# Patient Record
Sex: Male | Born: 1950 | Race: White | Hispanic: No | Marital: Married | Smoking: Never smoker
Health system: Southern US, Community
[De-identification: ages and names within clinical notes are randomized; demographics above are authoritative.]

## PROBLEM LIST (undated history)

## (undated) DIAGNOSIS — R310 Gross hematuria: Secondary | ICD-10-CM

## (undated) DIAGNOSIS — Z978 Presence of other specified devices: Secondary | ICD-10-CM

## (undated) DIAGNOSIS — Z87440 Personal history of urinary (tract) infections: Secondary | ICD-10-CM

## (undated) DIAGNOSIS — K602 Anal fissure, unspecified: Secondary | ICD-10-CM

## (undated) DIAGNOSIS — N400 Enlarged prostate without lower urinary tract symptoms: Secondary | ICD-10-CM

## (undated) DIAGNOSIS — F0280 Dementia in other diseases classified elsewhere without behavioral disturbance: Secondary | ICD-10-CM

## (undated) DIAGNOSIS — E785 Hyperlipidemia, unspecified: Secondary | ICD-10-CM

## (undated) DIAGNOSIS — R519 Headache, unspecified: Secondary | ICD-10-CM

## (undated) DIAGNOSIS — R972 Elevated prostate specific antigen [PSA]: Secondary | ICD-10-CM

## (undated) DIAGNOSIS — G4730 Sleep apnea, unspecified: Secondary | ICD-10-CM

## (undated) DIAGNOSIS — N138 Other obstructive and reflux uropathy: Secondary | ICD-10-CM

## (undated) DIAGNOSIS — M549 Dorsalgia, unspecified: Secondary | ICD-10-CM

## (undated) DIAGNOSIS — M1990 Unspecified osteoarthritis, unspecified site: Secondary | ICD-10-CM

## (undated) DIAGNOSIS — N419 Inflammatory disease of prostate, unspecified: Secondary | ICD-10-CM

## (undated) DIAGNOSIS — H547 Unspecified visual loss: Secondary | ICD-10-CM

## (undated) DIAGNOSIS — Z87442 Personal history of urinary calculi: Secondary | ICD-10-CM

## (undated) HISTORY — DX: Benign prostatic hyperplasia without lower urinary tract symptoms: N40.0

## (undated) HISTORY — DX: Inflammatory disease of prostate, unspecified: N41.9

## (undated) HISTORY — DX: Anal fissure, unspecified: K60.2

## (undated) HISTORY — DX: Dorsalgia, unspecified: M54.9

## (undated) HISTORY — DX: Hyperlipidemia, unspecified: E78.5

## (undated) HISTORY — DX: Personal history of urinary (tract) infections: Z87.440

## (undated) HISTORY — PX: LUMBAR FUSION: SHX111

---

## 1977-01-18 HISTORY — PX: VASECTOMY: SHX75

## 1993-12-24 ENCOUNTER — Encounter: Payer: Self-pay | Admitting: Physician Assistant

## 1994-10-04 ENCOUNTER — Encounter: Payer: Self-pay | Admitting: Family Medicine

## 1994-10-05 ENCOUNTER — Encounter: Payer: Self-pay | Admitting: Family Medicine

## 1994-10-05 LAB — CONVERTED CEMR LAB: RBC count: 4.97 10*6/uL

## 1996-11-15 ENCOUNTER — Encounter: Payer: Self-pay | Admitting: Family Medicine

## 1996-11-15 LAB — CONVERTED CEMR LAB: WBC, blood: 9.8 10*3/uL

## 1996-11-22 ENCOUNTER — Encounter: Payer: Self-pay | Admitting: Family Medicine

## 1996-11-23 ENCOUNTER — Encounter: Payer: Self-pay | Admitting: Family Medicine

## 1996-11-23 LAB — CONVERTED CEMR LAB
Blood Glucose, Fasting: 109 mg/dL
TSH: 2.85 microintl units/mL

## 1996-11-26 ENCOUNTER — Encounter: Payer: Self-pay | Admitting: Family Medicine

## 1996-11-26 LAB — CONVERTED CEMR LAB: Hgb A1c MFr Bld: 5.8 %

## 1997-01-16 ENCOUNTER — Encounter: Payer: Self-pay | Admitting: Family Medicine

## 1997-06-10 ENCOUNTER — Encounter: Payer: Self-pay | Admitting: Family Medicine

## 1997-06-10 LAB — CONVERTED CEMR LAB: TSH: 5.08 microintl units/mL

## 1999-10-12 ENCOUNTER — Encounter: Payer: Self-pay | Admitting: Family Medicine

## 1999-10-12 LAB — CONVERTED CEMR LAB: Blood Glucose, Fasting: 96 mg/dL

## 2001-02-28 ENCOUNTER — Ambulatory Visit (HOSPITAL_COMMUNITY): Admission: RE | Admit: 2001-02-28 | Discharge: 2001-02-28 | Payer: Self-pay | Admitting: Neurosurgery

## 2001-02-28 ENCOUNTER — Encounter: Payer: Self-pay | Admitting: Neurosurgery

## 2001-08-18 DIAGNOSIS — E785 Hyperlipidemia, unspecified: Secondary | ICD-10-CM

## 2001-08-18 HISTORY — DX: Hyperlipidemia, unspecified: E78.5

## 2001-08-22 ENCOUNTER — Encounter: Payer: Self-pay | Admitting: Family Medicine

## 2001-08-22 LAB — CONVERTED CEMR LAB
Blood Glucose, Fasting: 96 mg/dL
TSH: 2.37 microintl units/mL

## 2002-04-05 ENCOUNTER — Encounter: Payer: Self-pay | Admitting: Family Medicine

## 2002-04-05 ENCOUNTER — Encounter: Admission: RE | Admit: 2002-04-05 | Discharge: 2002-04-05 | Payer: Self-pay | Admitting: Family Medicine

## 2002-04-23 ENCOUNTER — Encounter: Payer: Self-pay | Admitting: Family Medicine

## 2002-04-23 LAB — CONVERTED CEMR LAB
Blood Glucose, Fasting: 99 mg/dL
PSA: 1.2 ng/mL
RBC count: 5.28 10*6/uL
WBC, blood: 5.7 10*3/uL

## 2002-05-18 ENCOUNTER — Ambulatory Visit (HOSPITAL_COMMUNITY): Admission: RE | Admit: 2002-05-18 | Discharge: 2002-05-19 | Payer: Self-pay | Admitting: Neurosurgery

## 2002-05-18 ENCOUNTER — Encounter: Payer: Self-pay | Admitting: Neurosurgery

## 2002-05-18 HISTORY — PX: CERVICAL FUSION: SHX112

## 2002-06-12 ENCOUNTER — Encounter: Admission: RE | Admit: 2002-06-12 | Discharge: 2002-06-12 | Payer: Self-pay | Admitting: Neurosurgery

## 2002-06-12 ENCOUNTER — Encounter: Payer: Self-pay | Admitting: Neurosurgery

## 2003-10-02 ENCOUNTER — Encounter: Payer: Self-pay | Admitting: Family Medicine

## 2003-10-02 LAB — CONVERTED CEMR LAB
RBC count: 4.81 10*6/uL
WBC, blood: 7.7 10*3/uL

## 2003-12-30 ENCOUNTER — Ambulatory Visit: Payer: Self-pay | Admitting: Family Medicine

## 2004-04-24 ENCOUNTER — Ambulatory Visit: Payer: Self-pay | Admitting: Family Medicine

## 2004-04-27 ENCOUNTER — Ambulatory Visit: Payer: Self-pay | Admitting: Family Medicine

## 2004-06-22 ENCOUNTER — Ambulatory Visit: Payer: Self-pay | Admitting: Family Medicine

## 2004-07-14 ENCOUNTER — Ambulatory Visit: Payer: Self-pay | Admitting: Family Medicine

## 2004-11-24 ENCOUNTER — Ambulatory Visit: Payer: Self-pay | Admitting: Family Medicine

## 2004-12-25 ENCOUNTER — Ambulatory Visit: Payer: Self-pay | Admitting: Family Medicine

## 2004-12-29 ENCOUNTER — Encounter: Admission: RE | Admit: 2004-12-29 | Discharge: 2004-12-29 | Payer: Self-pay | Admitting: Family Medicine

## 2005-01-26 ENCOUNTER — Ambulatory Visit: Payer: Self-pay | Admitting: Family Medicine

## 2005-11-10 ENCOUNTER — Ambulatory Visit: Payer: Self-pay | Admitting: Family Medicine

## 2006-01-03 ENCOUNTER — Ambulatory Visit: Payer: Self-pay | Admitting: Family Medicine

## 2006-01-03 LAB — CONVERTED CEMR LAB: TSH: 2.61 microintl units/mL

## 2006-01-06 ENCOUNTER — Ambulatory Visit: Payer: Self-pay | Admitting: Family Medicine

## 2006-01-27 ENCOUNTER — Ambulatory Visit: Payer: Self-pay | Admitting: Family Medicine

## 2006-03-21 ENCOUNTER — Ambulatory Visit: Payer: Self-pay | Admitting: Family Medicine

## 2006-08-19 ENCOUNTER — Ambulatory Visit: Payer: Self-pay | Admitting: Family Medicine

## 2006-08-19 DIAGNOSIS — G4700 Insomnia, unspecified: Secondary | ICD-10-CM | POA: Insufficient documentation

## 2006-11-09 ENCOUNTER — Ambulatory Visit: Payer: Self-pay | Admitting: Family Medicine

## 2006-11-09 DIAGNOSIS — L2089 Other atopic dermatitis: Secondary | ICD-10-CM

## 2007-04-27 ENCOUNTER — Ambulatory Visit: Payer: Self-pay | Admitting: Family Medicine

## 2007-04-27 DIAGNOSIS — H6980 Other specified disorders of Eustachian tube, unspecified ear: Secondary | ICD-10-CM | POA: Insufficient documentation

## 2007-04-27 DIAGNOSIS — N401 Enlarged prostate with lower urinary tract symptoms: Secondary | ICD-10-CM

## 2007-05-09 ENCOUNTER — Telehealth: Payer: Self-pay | Admitting: Family Medicine

## 2007-11-08 ENCOUNTER — Encounter: Payer: Self-pay | Admitting: Family Medicine

## 2007-11-08 DIAGNOSIS — E785 Hyperlipidemia, unspecified: Secondary | ICD-10-CM | POA: Insufficient documentation

## 2007-11-09 ENCOUNTER — Ambulatory Visit: Payer: Self-pay | Admitting: Family Medicine

## 2007-11-09 DIAGNOSIS — M5412 Radiculopathy, cervical region: Secondary | ICD-10-CM | POA: Insufficient documentation

## 2008-04-26 ENCOUNTER — Ambulatory Visit: Payer: Self-pay | Admitting: Family Medicine

## 2008-04-28 LAB — CONVERTED CEMR LAB
ALT: 28 units/L (ref 0–53)
Basophils Relative: 0.4 % (ref 0.0–3.0)
Bilirubin, Direct: 0.1 mg/dL (ref 0.0–0.3)
Chloride: 103 meq/L (ref 96–112)
Creatinine, Ser: 0.9 mg/dL (ref 0.4–1.5)
Eosinophils Relative: 2.8 % (ref 0.0–5.0)
HCT: 44.9 % (ref 39.0–52.0)
Hemoglobin: 15.5 g/dL (ref 13.0–17.0)
LDL Cholesterol: 131 mg/dL — ABNORMAL HIGH (ref 0–99)
MCV: 87.5 fL (ref 78.0–100.0)
Monocytes Absolute: 0.9 10*3/uL (ref 0.1–1.0)
Neutrophils Relative %: 55.1 % (ref 43.0–77.0)
PSA: 3.11 ng/mL (ref 0.10–4.00)
Potassium: 4.2 meq/L (ref 3.5–5.1)
RBC: 5.13 M/uL (ref 4.22–5.81)
Sodium: 138 meq/L (ref 135–145)
Total CHOL/HDL Ratio: 5
Total Protein: 7.6 g/dL (ref 6.0–8.3)
WBC: 7.5 10*3/uL (ref 4.5–10.5)

## 2008-05-06 ENCOUNTER — Encounter: Admission: RE | Admit: 2008-05-06 | Discharge: 2008-05-06 | Payer: Self-pay | Admitting: Neurosurgery

## 2008-05-08 ENCOUNTER — Ambulatory Visit: Payer: Self-pay | Admitting: Family Medicine

## 2008-05-17 ENCOUNTER — Ambulatory Visit: Payer: Self-pay | Admitting: Family Medicine

## 2008-05-17 LAB — CONVERTED CEMR LAB: OCCULT 3: NEGATIVE

## 2008-05-20 ENCOUNTER — Encounter (INDEPENDENT_AMBULATORY_CARE_PROVIDER_SITE_OTHER): Payer: Self-pay | Admitting: *Deleted

## 2009-06-04 ENCOUNTER — Encounter: Admission: RE | Admit: 2009-06-04 | Discharge: 2009-06-04 | Payer: Self-pay | Admitting: Neurosurgery

## 2009-06-10 ENCOUNTER — Ambulatory Visit: Payer: Self-pay | Admitting: Family Medicine

## 2009-06-10 DIAGNOSIS — R197 Diarrhea, unspecified: Secondary | ICD-10-CM

## 2009-06-12 ENCOUNTER — Encounter: Payer: Self-pay | Admitting: Family Medicine

## 2009-07-08 ENCOUNTER — Encounter: Admission: RE | Admit: 2009-07-08 | Discharge: 2009-07-08 | Payer: Self-pay | Admitting: Neurosurgery

## 2009-08-01 ENCOUNTER — Ambulatory Visit: Payer: Self-pay | Admitting: Family Medicine

## 2009-08-01 DIAGNOSIS — J309 Allergic rhinitis, unspecified: Secondary | ICD-10-CM | POA: Insufficient documentation

## 2009-08-01 DIAGNOSIS — M549 Dorsalgia, unspecified: Secondary | ICD-10-CM | POA: Insufficient documentation

## 2009-08-04 ENCOUNTER — Telehealth (INDEPENDENT_AMBULATORY_CARE_PROVIDER_SITE_OTHER): Payer: Self-pay | Admitting: *Deleted

## 2009-08-28 ENCOUNTER — Encounter: Payer: Self-pay | Admitting: Family Medicine

## 2009-08-28 ENCOUNTER — Ambulatory Visit: Payer: Self-pay | Admitting: Family Medicine

## 2009-08-28 DIAGNOSIS — L989 Disorder of the skin and subcutaneous tissue, unspecified: Secondary | ICD-10-CM | POA: Insufficient documentation

## 2009-09-11 ENCOUNTER — Inpatient Hospital Stay (HOSPITAL_COMMUNITY)
Admission: RE | Admit: 2009-09-11 | Discharge: 2009-10-05 | Payer: Self-pay | Source: Home / Self Care | Admitting: Neurosurgery

## 2009-09-30 ENCOUNTER — Ambulatory Visit: Payer: Self-pay | Admitting: Neurosurgery

## 2009-10-05 ENCOUNTER — Encounter: Payer: Self-pay | Admitting: Neurosurgery

## 2009-10-07 ENCOUNTER — Other Ambulatory Visit: Payer: Self-pay | Admitting: Neurosurgery

## 2009-10-18 ENCOUNTER — Encounter: Admission: RE | Admit: 2009-10-18 | Discharge: 2009-10-18 | Payer: Self-pay | Admitting: Neurosurgery

## 2009-10-21 ENCOUNTER — Ambulatory Visit: Payer: Self-pay | Admitting: Family Medicine

## 2009-10-21 DIAGNOSIS — R109 Unspecified abdominal pain: Secondary | ICD-10-CM

## 2009-10-31 ENCOUNTER — Telehealth: Payer: Self-pay | Admitting: Gastroenterology

## 2009-10-31 ENCOUNTER — Telehealth (INDEPENDENT_AMBULATORY_CARE_PROVIDER_SITE_OTHER): Payer: Self-pay | Admitting: *Deleted

## 2009-11-03 ENCOUNTER — Ambulatory Visit: Payer: Self-pay | Admitting: Gastroenterology

## 2009-11-03 DIAGNOSIS — Z860100 Personal history of colon polyps, unspecified: Secondary | ICD-10-CM | POA: Insufficient documentation

## 2009-11-03 DIAGNOSIS — K5900 Constipation, unspecified: Secondary | ICD-10-CM | POA: Insufficient documentation

## 2009-11-11 ENCOUNTER — Telehealth (INDEPENDENT_AMBULATORY_CARE_PROVIDER_SITE_OTHER): Payer: Self-pay | Admitting: *Deleted

## 2009-11-11 ENCOUNTER — Ambulatory Visit: Payer: Self-pay | Admitting: Physician Assistant

## 2009-11-11 LAB — CONVERTED CEMR LAB: Fecal Occult Bld: NEGATIVE

## 2009-12-10 ENCOUNTER — Encounter: Admission: RE | Admit: 2009-12-10 | Discharge: 2009-12-10 | Payer: Self-pay | Admitting: Neurosurgery

## 2009-12-16 ENCOUNTER — Encounter: Admission: RE | Admit: 2009-12-16 | Discharge: 2009-12-16 | Payer: Self-pay | Admitting: Neurosurgery

## 2009-12-22 ENCOUNTER — Encounter: Admission: RE | Admit: 2009-12-22 | Discharge: 2009-12-22 | Payer: Self-pay | Admitting: Neurosurgery

## 2010-01-05 ENCOUNTER — Telehealth: Payer: Self-pay | Admitting: Family Medicine

## 2010-01-05 ENCOUNTER — Ambulatory Visit: Payer: Self-pay | Admitting: Family Medicine

## 2010-01-05 DIAGNOSIS — M25529 Pain in unspecified elbow: Secondary | ICD-10-CM | POA: Insufficient documentation

## 2010-01-06 ENCOUNTER — Telehealth: Payer: Self-pay | Admitting: Gastroenterology

## 2010-01-09 ENCOUNTER — Encounter
Admission: RE | Admit: 2010-01-09 | Discharge: 2010-01-09 | Payer: Self-pay | Source: Home / Self Care | Attending: Neurosurgery | Admitting: Neurosurgery

## 2010-01-15 ENCOUNTER — Inpatient Hospital Stay (HOSPITAL_COMMUNITY)
Admission: RE | Admit: 2010-01-15 | Discharge: 2010-01-16 | Payer: Self-pay | Source: Home / Self Care | Attending: Neurosurgery | Admitting: Neurosurgery

## 2010-02-19 NOTE — Assessment & Plan Note (Signed)
Summary: abdominal pain/sheri   History of Present Illness Visit Type: Initial Consult Primary GI MD: Elie Goody MD [facility] Primary Georgette Helmer: Crawford Givens, MD Requesting Adisynn Suleiman: Crawford Givens, MD Chief Complaint: lower abdominal & constipation due to meds? History of Present Illness:   PLEASANT 59 YO MALE  NEW TO GI TODAY, REQUESTING DR. PATTERSON AS HIS WIFE SEES DR. PATTERSON. HE REPORTS HAVING A COLONOSCOPY ABOUT 10 YEARS AGO PER DR. EDWARDS, THINKS HE MAY HAVE HAD A POLYP. HE IS REFERRED TODAY FOR SEVERE CONSTIPATION AND LOWER ABDOMINAL PAIN. HE HAS HAD 2 BACK SURGERIES SINCE AUGUST 2011, THE SECOND DONE DUE TO INFECTION. HE HAS REQUIRED ALOT OF PAIN MEDICATION WHICH HE IS NOW TRYING TO WEAN OFF OF. HE WAS USING MIRALAX, STOOL SOFTENERS, DULCOLAX, ALL WITHOUT MUCH SUCCESS. NOW OVER THE PAST WEEK HE IS HAVING MORE REGULAR NORMAL BM'S AND FEELS BETTER. HE IS STILL HAVING LOWER BACK PAIN, AND PAIN AROUND IN TO HIS HIPS. ALONG WITH THIS HAS HAD BILATERAL LOWER ABDOMINAL DISCOMFORT, BLOATING, ACHING. NO MELENA OR HEME. APPETITE IS OK, HAD LOST 15 POUNDS WITH SURGERIES BUT GAINING BACK NOW.    GI Review of Systems    Reports abdominal pain, acid reflux, loss of appetite, and  weight loss.     Location of  Abdominal pain: lower abdomen. Weight loss of 15 pounds   Denies belching, bloating, chest pain, dysphagia with liquids, dysphagia with solids, heartburn, nausea, vomiting, vomiting blood, and  weight gain.      Reports change in bowel habits and  constipation.     Denies anal fissure, black tarry stools, diarrhea, diverticulosis, fecal incontinence, heme positive stool, hemorrhoids, irritable bowel syndrome, jaundice, light color stool, liver problems, rectal bleeding, and  rectal pain.   Current Medications (verified): 1)  Mometasone Furoate 0.1 %  Crea (Mometasone Furoate) .... Apply To Affected Area Sparingly Daily 2)  Ambien 10 Mg Tabs (Zolpidem Tartrate) .... 1/2-1 Tab By Mouth At  Bedtime As Needed For Insomnia 3)  Fish Oil   Oil (Fish Oil) .... 1,000 Once Daily 4)  Vitamin D 1000 Unit  Tabs (Cholecalciferol) .... 2,000 Once Daily 5)  Multivitamins   Tabs (Multiple Vitamin) .... Take 1 Tablet By Mouth Once A Day 6)  Vitamin B Complex-C   Caps (B Complex-C) .... Take 1 Tablet By Mouth Once A Day 7)  L-Lysine 1000 Mg Tabs (Lysine) .... Take 1 Tablet By Mouth Once A Day As Needed 8)  Oxycodone-Acetaminophen 5-325 Mg Tabs (Oxycodone-Acetaminophen) .... Take 1 Every 6 Hours By Mouth As Needed 9)  Doxycycline Hyclate 100 Mg Tabs (Doxycycline Hyclate) .... Two Times A Day  Allergies (verified): 1)  ! Cipro  Past History:  Past Medical History: Hyperlipidemia:(08/2001) Back pain, prev lumbar injection (Kritzer) Prev cervical spine surgery. Anal Fissure Urinary Tract Infection  Past Surgical History: VASECTOMY (DR [facility]) 1979 FUSION C 5/6; C6/7 :(DR. Gerlene Fee) (05/18/2002) 2011 L4-5 fusion with post op infection requiring 2nd operation and vancomycin per PICC  COLONOSCOPY --WITHIN NORMAL ZOXWRU:(0454) MRI -- NECK SPINAL STENOSIS C 5/6/  C6/7 SPINDYL C 3/4; C4/5:(04/05/2002) EMG  ARM ([place]) 10/09 CERVICAL MRI C6-7 // SPURRING AND MILD STENOSIS ; DISC C6/7; C3/4:(12/29/2004) CERVICAL MRI AND CT SOME MILD CHANGES ([place]) ( 10/09)  Family History: Reviewed history from 08/01/2009 and no changes required. Father: A 79  MINI STROKES Mother: dec 81 Debility  PARKINSONS  SISTER A  65 ARTHRITIS SISTER A 60 SISTER A 55 CV: + GF CAD  HBP: + UNCLE DM: +  UNCLE GOUT/ARTHRITIS: PROSTATE CANCER: BREAST/OVARIAN/ UTERINE CANCER: +MGM COLON CANCER:? GM HAD A CANCER WHICH INVOLVED THE BOWEL DEPRESSION:  ETOH/DRUG ABUSE: NEGATIVE OTHER : + STROKES FATHER  Social History: Reviewed history from 08/01/2009 and no changes required. Marital Status: Married// REMARRIED WIFE IS APASTOR   WORKED IN [place], currently looking for job as of 07/2009 Children: 3  BOYS  [employer] grad  Review of Systems       The patient complains of arthritis/joint pain and back pain.  The patient denies allergy/sinus, anemia, anxiety-new, blood in urine, breast changes/lumps, change in vision, confusion, cough, coughing up blood, depression-new, fainting, fatigue, fever, headaches-new, hearing problems, heart murmur, heart rhythm changes, itching, menstrual pain, muscle pains/cramps, night sweats, nosebleeds, pregnancy symptoms, shortness of breath, skin rash, sleeping problems, sore throat, swelling of feet/legs, swollen lymph glands, thirst - excessive , urination - excessive , urination changes/pain, urine leakage, vision changes, and voice change.         SEE HPI  Vital Signs:  Patient profile:   60 year old male Height:      71.50 inches Weight:      191.50 pounds BMI:     26.43 Pulse rate:   80 / minute Pulse rhythm:   regular BP sitting:   128 / 80  (left arm) Cuff size:   regular  Vitals Entered By: June McMurray CMA Duncan Dull) (November 03, 2009 10:11 AM)  Physical Exam  General:  Well developed, well nourished, no acute distress. Head:  Normocephalic and atraumatic. Eyes:  PERRLA, no icterus. Lungs:  Clear throughout to auscultation. Heart:  Regular rate and rhythm; no murmurs, rubs,  or bruits. Abdomen:  SOFT, NONTENDER, NO MASS OR HSM,BS+ Rectal:  NOT DONE Msk:  WEARING BACK BRACE Neurologic:  Alert and  oriented x4;  grossly normal neurologically. Psych:  Alert and cooperative. Normal mood and affect.  Impression & Recommendations:  Problem # 1:  ABDOMINAL PAIN OTHER SPECIFIED SITE (ICD-789.09) Assessment New 59 YO MALE WITH LOWER ABDOMINAL PAIN-BILATERAL, AND NEW CONSTIPATION X 2 MONTHS. SUSPECT SXS SECONDARY TO OBSTIPATION  FROM PAIN MEDS/ IMPROVING.  R/O OCCULT COLON LESION, DIVERTICULAR DISEASE. ADVISED TO CONTINUE WEANING PAIN MEDS AS HE CAN TOLERATE. MIRALAX 17 GM DAILY AS NEEDED. SCHEDULE FOR COLONOSCOPY WITH DR. PATTERSON,  PROCEDURE DISCUSSED IN DETAIL WITH PT. INCLUDING RISKS, BENEFITS AND ALTERNATIVES-HE WOULD LIKE TO WAIT ANOTHER MONTH TO ALLOW MORE TIME TO GET OVER HIS SPINE SURGERY. HEMOCULT SPECIMEN OBTAIN RECORDS FROM PREVIOUS COLONOSCOPY (EDWARDS). Orders: Colonoscopy (Colon)  Patient Instructions: 1)  We have given you an order to take to our lab in the basement for Hemoccult testing. 2)  We sent the prescription [facility] the colonoscopy prep [facility] [address] [facility] [place]. 3)  We faxed  Dr. Ross Ludwig the release of information form you signed to get the copy of the Colonoscopy you had done at that practice. 4)  Copy sent to : Crawford Givens, MD  Prescriptions: MOVIPREP 100 GM  SOLR (PEG-KCL-NACL-NASULF-NA ASC-C) As per prep instructions.  #1 x 0   Entered by:   Lowry Ram NCMA   Authorized by:   Sammuel Cooper PA-c   Signed by:   Lowry Ram NCMA on 11/03/2009   Method used:   [facility]        [facility]  [address] [phone]* (retail)       [address]       [place], [place]  [address]       Ph: [phone]       Fax: [phone]   RxID:   [id number]

## 2010-02-19 NOTE — Progress Notes (Signed)
Summary: Update  Phone Note Call from Patient Call back at Home Phone [phone]   Caller: Patient Call For: Dr. Russella Dar Reason for Call: Talk to Nurse Summary of Call: I called to inform patient that he did not need to have another pre-visit before his colon and he just wanted it to be noted in his chart that he has had two lower back surgeries and is planning to have another one soon, he just wanted [place] to be aware of this before his procedure, he only needs a call back if this effects his procedure Initial call taken by: [place] Johnson,  January 06, 2010 9:06 AM  Follow-up for Phone Call        Dr Russella Dar please advise if this is an issue.  He was asked to keep his appointment  Follow-up by: Darcey Nora RN, CGRN,  January 06, 2010 10:54 AM  Additional Follow-up for Phone Call Additional follow up Details #1::        Keep appt. OK for colonoscopy before back surgery or at least 6 weeks afterward unless his surgeon has other advice. Additional Follow-up by: Meryl Dare MD Clementeen Graham,  January 06, 2010 11:26 AM    Additional Follow-up for Phone Call Additional follow up Details #2::    patient aware of Dr Ardell Isaacs recommendations Follow-up by: Darcey Nora RN, CGRN,  January 06, 2010 11:31 AM

## 2010-02-19 NOTE — Progress Notes (Signed)
Summary: Need GI referral- severe  abd pain-   Phone Note Call from Patient   Summary of Call: [phone]- Pt having lower GI pain, says the pain is not any better.  No bleeding, Lower GI severe pain,  Need referral to Dr. Jarold Motto.Daine Gip  October 31, 2009 9:08 AM   Initial call taken by: Daine Gip,  October 31, 2009 9:09 AM  Follow-up for Phone Call        ordered Follow-up by: Crawford Givens MD,  October 31, 2009 9:42 AM  Additional Follow-up for Phone Call Additional follow up Details #1::        Called pt to make him aware of appt...had to leave message.  [facility] ,  PA will see the pt today at 1:30pm, pt to arrive at 1:15pm..Marland KitchenMarland KitchenDaine Gip  October 31, 2009 11:33 AM  Additional Follow-up by: Daine Gip,  October 31, 2009 11:33 AM    Additional Follow-up for Phone Call Additional follow up Details #2::    Pts  wife did return my call on Friday, Oct 14th , says pt  could not go to the appt w/ [facility]. Says she will call their office and rescduled. I reviewed the schedule and wife cancelled the appt, but, I did not see a rescheduled appt..fyi to Dr. Para March.Daine Gip  November 02, 2009 7:44 PM Follow-up by: Crawford Givens MD,  November 02, 2009 8:22 PM  Additional Follow-up for Phone Call Additional follow up Details #3:: Details for Additional Follow-up Action Taken: please make sure patient is rescheduled with GI. thanks.  If scheduled, no need to reply to me.  Pt did have an appt scheduled.Daine Gip  November 06, 2009 9:06 AM Additional Follow-up by: Crawford Givens MD,  November 02, 2009 8:23 PM

## 2010-02-19 NOTE — Assessment & Plan Note (Signed)
Summary: OUT OF COUNTRY HAS PARASITES/RBH   Vital Signs:  Patient profile:   60 year old male Weight:      203.75 pounds BMI:     28.12 Temp:     98.5 degrees F oral Pulse rate:   64 / minute Pulse rhythm:   regular BP sitting:   118 / 78  (left arm) Cuff size:   large  Vitals Entered By: Sydell Axon LPN (Jun 10, 2009 11:21 AM) CC: ? Has been out of the country, ? has parasites, having more BM's than ususal and rectal itching   History of Present Illness: Dr Milinda Antis told the pt's wife that she has parasites. This was last year on a PE and sjhe was assx and going back to [place]. She had had a  stool test done prior to the PE that was positive. He hasnever been symptomatic either but has lived all of the same places and eaten all of the same foods as she. He has had diarrhea fairly regularly while over in [place] altho he has had  better bowel movements today than previously. He does have  itching of the rectum. They have both been living in [place]. They are now back in the [place] for good, looking for jobs. He feels well and has no complaints except his back, an ongoing problem of many years. He had injections twice while in [place] which helped but have not resolved the pain. He did have neck surgery which has totally relieved the problem at that level.  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 Apply To Affected Area Sparingly  Qd 2)  Ambien 10 Mg  Tabs (Zolpidem Tartrate) .Marland Kitchen.. 1 At [facility] As Needed By Mouth 3)  Propoxyphene Hcl 65 Mg  Caps (Propoxyphene Hcl) .Marland Kitchen.. 1 Q 6 Hours As Needed As Needed For Headache 4)  Mometasone Furoate 0.1 %  Crea (Mometasone Furoate) .... Use As Directed 5)  Keflex 500 Mg Caps (Cephalexin) .... One Tab By Mouth 4 Times A Day Fotr 2 Weeks.  Allergies: 1)  ! Cipro  Family History: Father: A 62  MINI STROKES Mother: dec 81 Debility  PARKINSONS  SISTER A  65 ARTHRITIS SISTER A 60 SISTER A 55 CV: + GF CAD  HBP: + UNCLE DM: + UNCLE GOUT/ARTHRITIS: PROSTATE CANCER: BREAST/OVARIAN/ UTERINE CANCER: +MGM COLON CANCER: DEPRESSION:  ETOH/DRUG ABUSE: NEGATIVE OTHER : + STROKES FATHER  Physical Exam  General:  Well-developed,well-nourished,in no acute distress; alert,appropriate and cooperative throughout examination, mildly congested, thin. Head:  Normocephalic and atraumatic without obvious abnormalities. No apparent alopecia, mild  balding. Eyes:  Conjunctiva clear bilaterally.  Ears:  External ear exam shows no significant lesions or deformities.  Otoscopic examination reveals clear canals, tympanic membranes are intact bilaterally without bulging, retraction, inflammation or discharge. Hearing is grossly normal bilaterally. Nose:  External nasal examination shows no deformity or inflammation. Nasal mucosa are pink and moist without lesions or exudates. Mouth:  Oral mucosa  and oropharynx without lesions or exudates.  Teeth in good repair. Neck:  No deformities, masses, or tenderness noted. Lungs:  Normal respiratory effort, chest expands symmetrically. Lungs are clear to auscultation, no crackles or wheezes. Heart:  Normal rate and regular rhythm. S1 and S2 normal without gallop, murmur, click, rub or other extra sounds. Abdomen:  Bowel sounds positive,abdomen soft and non-tender without masses, organomegaly or hernias noted. Msk:  Back sore with getting up and down from the exam table  supine.   Impression & Recommendations:  Problem # 1:  DIARRHEA OF PRESUMED INFECTIOUS ORIGIN (ICD-009.3) Assessment New  Will send stool for culture, O&P and Giardia. Treat if indicated. Orders: T-Culture, Stool ([id number]) T-Stool for O&P ([id number]) T-Stool Giardia / Crypto- EIA (14782)  Discussed symptom control and diet. Call if worsening of symptoms or signs of dehydration.   Problem # 2:  HYPERLIPIDEMIA (ICD-272.4) Assessment: Unchanged  Should be checked in the future. Will refer to Dr Para March to assume care and can get PE when convenient.  Labs Reviewed: SGOT: 26 (04/26/2008)   SGPT: 28 (04/26/2008)   HDL:41.10 (04/26/2008)  LDL:131 (04/26/2008)  Chol:200 (04/26/2008)  Trig:140.0 (04/26/2008)  Problem # 3:  HYPERTROPHY PROSTATE W/UR OBST & OTH LUTS (ICD-600.01) Assessment: Unchanged Reqsonably controlled at this point.  Complete Medication List: 1)  Mometasone Furoate 0.1 % Crea (Mometasone furoate) .... Apply to affected area sparingly daily 2)  Ambien 10 Mg Tabs (Zolpidem tartrate) .Marland Kitchen.. 1 by mouth at bedtime as needed 3)  Propoxyphene Hcl 65 Mg Caps (Propoxyphene hcl) .Marland Kitchen.. 1 q 6 hours as needed as needed for headache 4)  Mometasone Furoate 0.1 % Crea (Mometasone furoate) .... Use as directed  Patient Instructions: 1)  Schedule appt for Dr Para March to establish.  Current Allergies (reviewed today): ! CIPRO  Appended Document: OUT OF COUNTRY HAS PARASITES/[facility]H

## 2010-02-19 NOTE — Assessment & Plan Note (Signed)
Summary: FOLLOW PER DR SCHALLER/RBH   Vital Signs:  Patient profile:   60 year old male Height:      71.50 inches Weight:      202.25 pounds BMI:     27.92 Temp:     97.8 degrees F oral Pulse rate:   72 / minute Pulse rhythm:   regular BP sitting:   144 / 88  (left arm) Cuff size:   large  Vitals Entered By: Delilah Shan CMA Marian Meneely Dull) (August 01, 2009 8:10 AM) CC: Follow up per RNS   History of Present Illness: Pain in lower back without radiation to hips.  Has had injections.  Pain interferes with sleep.  Last pain free day was 15 years ago.  Initial pain/injury was at  ~60 years of age.   Pain with walking.  Has been exercising in the pool with some effect.  Would like to delay surgery if at all possible.  Stretching daily.  Using nabumetone and tramadol with some relief, but has h/o blisters in mouth with prolonged NSAID use.  Plans to see Dr. Gerlene Fee if last shot doesn't hold.  L hand and R toes have been numb persistently.   Insomnia- Has had some relief with ambien.  No h/o abuse or intolerance.  Used rarely.  Coffee in AM, not  much afternoon.  More related to pain.  Has new mattress and is improved from before.    Allergies- taking benadryl and had used nasal steroids before.  Stuffy nose has returned.  No fevers.    Allergies: 1)  ! Cipro  Past History:  Past Medical History: Hyperlipidemia:(08/2001) Back pain, prev lumbar injection ([facility]ritzer) Prev cervical spine surgery.  Family History: Father: A 40  MINI STROKES Mother: dec 81 Debility  PARKINSONS  SISTER A  65 ARTHRITIS SISTER A 60 SISTER A 55 CV: + GF CAD  HBP: + UNCLE DM: + UNCLE GOUT/ARTHRITIS: PROSTATE CANCER: BREAST/OVARIAN/ UTERINE CANCER: +MGM COLON CANCER: DEPRESSION:  ETOH/DRUG ABUSE: NEGATIVE OTHER : + STROKES FATHER  Social History: Marital Status: Married// REMARRIED WIFE IS APASTOR   WORKED IN [place], currently looking for job as of 07/2009 Children: 3 BOYS  [employer]  grad  Review of Systems       See HPI.  Otherwise noncontributory.    Physical Exam  General:  GEN: nad, alert and oriented HEENT: mucous membranes moist, oral ulcer healing on L buccal membrane, nasal epithelium mildly injected NECK: supple w/o LA CV: regular rate and rhythm  PULM: ctab, no inc wob ABD: soft, +bs EXT: no edema SKIN: no acute rash  Back:lower lumbar area tender to palpation but without change in bilateral lower extremity strength. SLR neg B and dtrs wnl for bilateral lower extremities.    Impression & Recommendations:  Problem # 1:  ALLERGIC RHINITIS (ICD-477.9) Use nasal steroid and follow up as needed.  He agrees.  His updated medication list for this problem includes:    Flonase 50 Mcg/act Susp (Fluticasone propionate) .Marland Kitchen... 1-2 sprays per nostril per day  Problem # 2:  INSOMNIA-SLEEP DISORDER-UNSPEC (ICD-780.52) D/w patient.  Can use ambien as needed and follow up as needed.  Routine instructions given for med and he understands.  His updated medication list for this problem includes:        Ambien 10 Mg Tabs (Zolpidem tartrate) .Marland Kitchen... 1/2-1 tab by mouth at bedtime as needed for insomnia  Problem # 3:  BACK PAIN (ICD-724.5) Continue current meds; can restart NSAID after ulcer resolved.  The following medications were removed from the medication list:    Propoxyphene Hcl 65 Mg Caps (Propoxyphene hcl) .Marland Kitchen... 1 q 6 hours as needed as needed for headache His updated medication list for this problem includes:    Nabumetone 500 Mg Tabs (Nabumetone) .Marland Kitchen... Take 1 tablet by mouth two times a day    Tramadol Hcl 50 Mg Tabs (Tramadol hcl) .Marland Kitchen... Take 1 tablet by mouth every 4-6 hours as needed pain  Complete Medication List: 1)  Mometasone Furoate 0.1 % Crea (Mometasone furoate) .... Apply to affected area sparingly daily 2)  Ambien 10 Mg Tabs (Zolpidem tartrate) .Marland Kitchen.. 1 by mouth at bedtime as needed 3)  Nabumetone 500 Mg Tabs (Nabumetone) .... Take 1 tablet by mouth  two times a day 4)  Tramadol Hcl 50 Mg Tabs (Tramadol hcl) .... Take 1 tablet by mouth every 4-6 hours as needed pain 5)  Flonase 50 Mcg/act Susp (Fluticasone propionate) .Marland Kitchen.. 1-2 sprays per nostril per day 6)  Ambien 10 Mg Tabs (Zolpidem tartrate) .... 1/2-1 tab by mouth at bedtime as needed for insomnia  Patient Instructions: 1)  Please schedule a follow-up appointment as needed, schedule a physical this fall.  Take the [place] as needed at night.  You can use the tramadol for pain at night, too.  Both can make you drowsy.  Take the nabumetone with food.  Use flonase daily.  Prescriptions: AMBIEN 10 MG TABS (ZOLPIDEM TARTRATE) 1/2-1 tab by mouth at bedtime as needed for insomnia  #30 x 0   Entered and Authorized by:   Crawford Givens MD   Signed by:   Crawford Givens MD on 08/01/2009   Method used:   Print then Give to Patient   RxID:   [id number] FLONASE 50 MCG/ACT SUSP (FLUTICASONE PROPIONATE) 1-2 sprays per nostril per day  #1 x 5   Entered and Authorized by:   Crawford Givens MD   Signed by:   Crawford Givens MD on 08/01/2009   Method used:   [facility]        [facility] [employer] [phone]* (retail)       [address] [place], [place]  [address]       Ph: [phone] or [phone]       Fax: [phone]   RxID:   [id number]   Current Allergies (reviewed today): ! CIPRO

## 2010-02-19 NOTE — Letter (Signed)
Summary: Application for Handicapped Placard  Application for Handicapped Placard   Imported By: Maryln Gottron 09/02/2009 11:03:23  _____________________________________________________________________  External Attachment:    Type:   Image     Comment:   External Document

## 2010-02-19 NOTE — Assessment & Plan Note (Signed)
Summary: CHECK PLACE ON R ARM/CLE   Vital Signs:  Patient profile:   60 year old male Height:      71.50 inches Weight:      204.50 pounds BMI:     28.23 Temp:     98.2 degrees F oral Pulse rate:   80 / minute Pulse rhythm:   regular BP sitting:   142 / 80  (left arm) Cuff size:   large  Vitals Entered By: Delilah Shan CMA Duncan Dull) (August 28, 2009 9:01 AM) CC: Check place on arm   History of Present Illness: Back surgery planned per Dr. Gerlene Fee, lumbar fusion and diskectomy. Pain with walking and needs parking sticker form.  Aug 25th is day of operation.   new lesion on R forearm.  Now with peripheral bruise 1.5x1.5 cm, central darker area is 37mmx5mm.  Central area started on Sunday, bruise noted in Tuesday.  No fevers.  No drainage.  slightly tender to palpation.  No other new lesion.    Allergies: 1)  ! Cipro  Review of Systems       See HPI.  Otherwise negative.    Physical Exam  General:  R forearm with flat bruise noted as above, centrally with smaller darker area.  There is a small nodular density in the dermis, but this is much smaller thant the bruise or even the central area itself.  no drainage.  No other similar lesions.     Impression & Recommendations:  Problem # 1:  UNSPECIFIED DISORDER OF SKIN&SUBCUTANEOUS TISSUE (ICD-709.9) observe and follow up as needed.  I think the patient had a small, relatively deep bruise and he may be developing a dermatofibroma.  I would not I&D since it is so small and doesn't appear infected.  Border marked, patient to call back if enlarging or other changes.   Problem # 2:  BACK PAIN (ICD-724.5) parking sticker done.  follow up with spine center.  His updated medication list for this problem includes:    Nabumetone 500 Mg Tabs (Nabumetone) .Marland Kitchen... Take 1 tablet by mouth two times a day    Tramadol Hcl 50 Mg Tabs (Tramadol hcl) .Marland Kitchen... Take 1 tablet by mouth every 4-6 hours as needed pain  Complete Medication List: 1)  Mometasone  Furoate 0.1 % Crea (Mometasone furoate) .... Apply to affected area sparingly daily 2)  Nabumetone 500 Mg Tabs (Nabumetone) .... Take 1 tablet by mouth two times a day 3)  Tramadol Hcl 50 Mg Tabs (Tramadol hcl) .... Take 1 tablet by mouth every 4-6 hours as needed pain 4)  Flonase 50 Mcg/act Susp (Fluticasone propionate) .Marland Kitchen.. 1-2 sprays per nostril per day 5)  Ambien 10 Mg Tabs (Zolpidem tartrate) .... 1/2-1 tab by mouth at bedtime as needed for insomnia 6)  Fish Oil Oil (Fish oil) .... 1,000 once daily 7)  Vitamin D 1000 Unit Tabs (Cholecalciferol) .... 2,000 once daily 8)  Multivitamins Tabs (Multiple vitamin) .... Take 1 tablet by mouth once a day 9)  Vitamin B Complex-c Caps (B complex-c) .... Take 1 tablet by mouth once a day 10)  L-lysine 1000 Mg Tabs (Lysine) .... Take 1 tablet by mouth once a day as needed  Patient Instructions: 1)  Let me know if the spot on your arm gets bigger.  Take care.  2)  Please schedule a follow-up appointment as needed .   Current Allergies (reviewed today): ! CIPRO

## 2010-02-19 NOTE — Letter (Signed)
Summary: [facility]: Immunoassay Fecal Occult Blood (iFOB) Order Form  [facility]  [address] [place], [place] [address]   Phone: [phone]  Fax: [phone]      [facility]: Immunoassay Fecal Occult Blood (iFOB) Order Form   November 03, 2009 MRN: [id number]   Lucas Robinson 23-May-1950   Physicican Name:Amy Esterwood PA  Diagnosis Code:789.09,564.00,V12.72      Mike Gip PA Dr Claudette Head

## 2010-02-19 NOTE — Assessment & Plan Note (Signed)
Summary: PAIN IN LEFT SIDE PELVIC AREA AND LEFT ELBOW/DLO   Vital Signs:  Patient profile:   60 year old male Height:      71.50 inches Weight:      194.75 pounds BMI:     26.88 Temp:     98.3 degrees F oral Pulse rate:   80 / minute Pulse rhythm:   regular BP sitting:   114 / 80  (left arm) Cuff size:   large  Vitals Entered By: Delilah Shan CMA Duncan Dull) (January 05, 2010 8:35 AM) CC: Pain in left side, pelvic area and left elbow.   History of Present Illness: Had follow up with [facility] re:inc in back pain. 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  Pain with walking and in R leg.  Taking valium at night.  Was off the percocet, now needing up to three times a day.  Surgery may be needed again.    Continues to have LLQ pain.  Had seen GI.  Colonoscopy not done yet.  LLQ pain didn't increase with the percocet use.  LLQ pain is some better with percocet.   L elbow pain for 3 months.   Medial epicondyle.  R handed.  Water walking 6x/week.  Was taking 1 advil per day, without relief.  Percocet doesn't help that.  No trauma  No FCNAV.  No blood in stools, IFOB negative.  Inc in constipation with percocet use, now back on dulcolax.    Recent sed rate was 18 and needle aspiration/cx was negative.   Allergies: 1)  ! Cipro  Past History:  Past Medical History: Last updated: 11/03/2009 Hyperlipidemia:(08/2001) Back pain, prev lumbar injection ([facility]) Prev cervical spine surgery. Anal Fissure Urinary Tract Infection  Review of Systems       See HPI.  Otherwise negative.    Physical Exam  General:  A&O in NAD but uncomfortable due to back RRR CTAB abdomen soft, mildly tender to palpation in LLQ- at recent baseline per patient, normal BS, no masses ext w/o edema skin w/o rash R SLR positive L medial epicondyle tender to palpation and painful with pronation/elbow flexion.  not tender to palpation laterally.  no skin changes.    Impression &  Recommendations:  Problem # 1:  ABDOMINAL PAIN OTHER SPECIFIED SITE (ICD-789.09) >25 min spent with patient, at least half of which was spent on counseling ZO:XWRU.   I would have patient follow up with GI about colonoscopy.  This may not be GI related but if related to constipation the prep would be therapeutic.   colonoscopy could be diagnostic and therapeutic itself, if path found.  If colonoscopy neg, then patient would be reassured and we could investigate other sources, including referred pain from back. Okay for outpatient follow up, esp with sed rate not elevated.  He agreed with plan.  LLQ pain.  His updated medication list for this problem includes:    Oxycodone-acetaminophen 5-325 Mg Tabs (Oxycodone-acetaminophen) .Marland Kitchen... Take 1 every 6 hours by mouth as needed  Problem # 2:  ELBOW PAIN, LEFT (ICD-719.42) medial epicondylitis.  d/w patient EA:VWUJWJXB rest and ibuprofen with GI caution.  He agrees.   Complete Medication List: 1)  Mometasone Furoate 0.1 % Crea (Mometasone furoate) .... Apply to affected area sparingly daily 2)  Fish Oil Oil (Fish oil) .... 1,000 once daily 3)  Vitamin D 1000 Unit Tabs (Cholecalciferol) .... 2,000 once daily 4)  Multivitamins Tabs (Multiple vitamin) .... Take 1 tablet by mouth once a day  5)  Vitamin B Complex-c Caps (B complex-c) .... Take 1 tablet by mouth once a day 6)  L-lysine 1000 Mg Tabs (Lysine) .... Take 1 tablet by mouth once a day as needed 7)  Oxycodone-acetaminophen 5-325 Mg Tabs (Oxycodone-acetaminophen) .... Take 1 every 6 hours by mouth as needed 8)  Diazepam 5 Mg Tabs (Diazepam) .... Take 1 tab by mouth at bedtime  Patient Instructions: 1)  We'll call over to GI. In the meantime, take 2-3 ibuprofen three times a day with food.  Rest your left elbow and ice it down.  Take care.    Orders Added: 1)  Est. Patient Level IV [69629]    Current Allergies (reviewed today): ! CIPRO

## 2010-02-19 NOTE — Progress Notes (Signed)
----   Converted from flag ---- ---- 08/04/2009 8:51 AM, Delilah Shan CMA (AAMA) wrote: Patient asked that I leave a message with the wife.  She was advised and states he will either contact Dr. Randa Evens or call us if he would like to schedule with our GI Dept.  ---- 08/04/2009 8:51 AM, Crawford Givens MD wrote: If it has been >10 years, then I would rec follow up  with Dr. Randa Evens.  Thanks.    ---- 08/04/2009 8:32 AM, Delilah Shan CMA (AAMA) wrote: Pulled old paper chart and no info concerning colonoscopy.  Phoned pt.  He says he had it with a Dr. Randa Evens in [place] and it was probably > 10 years ago.  He is willing to schedule if you think it is necessary.  ---- 08/03/2009 4:43 PM, Crawford Givens MD wrote: When and where did patient have last colonoscopy?  I need to review and notify patient about the next time he'll be due.  thanks. ------------------------------

## 2010-02-19 NOTE — Letter (Signed)
Summary: [facility] Instructions  [facility]  [address] [place], [place] [address]   Phone: [phone]  Fax: [phone]       Lucas Robinson    08-03-1950    MRN: [id number]        Procedure Day /Date:12-24-09     Arrival Time:7:30 AM      Procedure Time: 8:30 Am     Location of Procedure:                    X     [facility] (4th Floor)                        PREPARATION FOR COLONOSCOPY WITH MOVIPREP   Starting 5 days prior to your procedure 12-18-09 do not eat nuts, seeds, popcorn, corn, beans, peas,  salads, or any raw vegetables.  Do not take any fiber supplements (e.g. Metamucil, Citrucel, and Benefiber).  THE DAY BEFORE YOUR PROCEDURE         DATE:12-23-09  DAY: Tuesday  1.  Drink clear liquids the entire day-NO SOLID FOOD  2.  Do not drink anything colored red or purple.  Avoid juices with pulp.  No orange juice.  3.  Drink at least 64 oz. (8 glasses) of fluid/clear liquids during the day to prevent dehydration and help the prep work efficiently.  CLEAR LIQUIDS INCLUDE: Water Jello Ice Popsicles Tea (sugar ok, no milk/cream) Powdered fruit flavored drinks Coffee (sugar ok, no milk/cream) Gatorade Juice: apple, white grape, white cranberry  Lemonade Clear bullion, consomm, broth Carbonated beverages (any kind) Strained chicken noodle soup Hard Candy                             4.  In the morning, mix first dose of MoviPrep solution:    Empty 1 Pouch A and 1 Pouch B into the disposable container    Add lukewarm drinking water to the top line of the container. Mix to dissolve    Refrigerate (mixed solution should be used within 24 hrs)  5.  Begin drinking the prep at 5:00 p.m. The MoviPrep container is divided by 4 marks.   Every 15 minutes drink the solution down to the next mark (approximately 8 oz) until the full liter is complete.   6.  Follow completed prep with 16 oz of clear liquid of your choice (Nothing red or  purple).  Continue to drink clear liquids until bedtime.  7.  Before going to bed, mix second dose of MoviPrep solution:    Empty 1 Pouch A and 1 Pouch B into the disposable container    Add lukewarm drinking water to the top line of the container. Mix to dissolve    Refrigerate  THE DAY OF YOUR PROCEDURE      DATE: 12-24-09   DAY: Wednesday  Beginning at 3:30 AM (5 hours before procedure):         1. Every 15 minutes, drink the solution down to the next mark (approx 8 oz) until the full liter is complete.  2. Follow completed prep with 16 oz. of clear liquid of your choice.    3. You may drink clear liquids until 6:30 AM  (2 HOURS BEFORE PROCEDURE).   MEDICATION INSTRUCTIONS  Unless otherwise instructed, you should take regular prescription medications with a small sip of water   as early as possible the  morning of your procedure.        OTHER INSTRUCTIONS  You will need a responsible adult at least 61 years of age to accompany you and drive you home.   This person must remain in the waiting room during your procedure.  Wear loose fitting clothing that is easily removed.  Leave jewelry and other valuables at home.  However, you may wish to bring a book to read or  an iPod/MP3 player to listen to music as you wait for your procedure to start.  Remove all body piercing jewelry and leave at home.  Total time from sign-in until discharge is approximately 2-3 hours.  You should go home directly after your procedure and rest.  You can resume normal activities the  day after your procedure.  The day of your procedure you should not:   Drive   Make legal decisions   Operate machinery   Drink alcohol   Return to work  You will receive specific instructions about eating, activities and medications before you leave.    The above instructions have been reviewed and explained to me by   _______________________    I fully understand and can verbalize these  instructions _____________________________ Date _________

## 2010-02-19 NOTE — Assessment & Plan Note (Signed)
Summary: ABD PAIN   Vital Signs:  Patient profile:   60 year old male Height:      71.50 inches Weight:      191.75 pounds Temp:     98.1 degrees F oral Pulse rate:   80 / minute Pulse rhythm:   regular BP sitting:   130 / 82  (left arm)  Vitals Entered By: Melody Comas (October 21, 2009 3:53 PM) CC: abdominal pain   History of Present Illness: Abdominal pain for about 5 days, resolved as of today.  Prev with L4-5 decompression and cage, complicated by infection requiring 2nd operation and vanc per PICC.  No fevers.  No vomiting.  No blood in stool.  BMs have been normal throughout.  Pt prev on frequent doses of hydrocodone for pain, now tapered.  Asking for advice.   Allergies: 1)  ! Cipro  Past History:  Past Surgical History: VASECTOMY ([facility]) 1979 COLONOSCOPY --WITHIN NORMAL ZOXWRU:(0454) MRI -- NECK SPINAL STENOSIS C 5/6/  C6/7 SPINDYL C 3/4; C4/5:(04/05/2002) FUSION C 5/6; C6/7 :(DR. Gerlene Fee) :(05/18/2002) CERVICAL MRI C6-7 // SPURRING AND MILD STENOSIS ; DISC C6/7; C3/4:(12/29/2004) CERVICAL MRI AND CT SOME MILD CHANGES ([place]) ( 10/09) EMG  ARM ([place]) 10/09 2011 L4-5 fusion with post op infection requiring 2nd operation and vancomycin per PICC  Review of Systems       See HPI.  Otherwise negative.    Physical Exam  General:  A&O in NAD but uncomfortable due to back; most comfortable supine on exam table. RRR CTAB abdomen soft, not tender to palpation, normal BS, no masses ext w/o edema skin w/o rash   Impression & Recommendations:  Problem # 1:  ABDOMINAL PAIN OTHER SPECIFIED SITE (ICD-789.09) Resolved.  This is potentially due to opiate use and constipation along with anesthesia.  Would rec stool softener and sparing use of pain meds, now off opiates.  He agrees.  Okay for outpatient follow up.  Benign exam.  He'll call back as needed.  He agrees.  The following medications were removed from the medication list:    Nabumetone 500 Mg  Tabs (Nabumetone) .Marland Kitchen... Take 1 tablet by mouth two times a day    Tramadol Hcl 50 Mg Tabs (Tramadol hcl) .Marland Kitchen... Take 1 tablet by mouth every 4-6 hours as needed pain His updated medication list for this problem includes:    Vicodin 5-500 Mg Tabs (Hydrocodone-acetaminophen) .Marland Kitchen... Take up to 2 every 5 to 6 hrs as needed for pain  Orders: Specimen Handling (09811) T-Culture, Urine ([id number])  Complete Medication List: 1)  Mometasone Furoate 0.1 % Crea (Mometasone furoate) .... Apply to affected area sparingly daily 2)  Flonase 50 Mcg/act Susp (Fluticasone propionate) .Marland Kitchen.. 1-2 sprays per nostril per day 3)  Ambien 10 Mg Tabs (Zolpidem tartrate) .... 1/2-1 tab by mouth at bedtime as needed for insomnia 4)  Fish Oil Oil (Fish oil) .... 1,000 once daily 5)  Vitamin D 1000 Unit Tabs (Cholecalciferol) .... 2,000 once daily 6)  Multivitamins Tabs (Multiple vitamin) .... Take 1 tablet by mouth once a day 7)  Vitamin B Complex-c Caps (B complex-c) .... Take 1 tablet by mouth once a day 8)  L-lysine 1000 Mg Tabs (Lysine) .... Take 1 tablet by mouth once a day as needed 9)  Vicodin 5-500 Mg Tabs (Hydrocodone-acetaminophen) .... Take up to 2 every 5 to 6 hrs as needed for pain  Patient Instructions: 1)  Take a stool softener (colace 100mg ) once or twice a day.  Use your pain meds as needed and let me know if you have other concerns.  Call back after you are feeling better and we can refer you to Dr. Jarold Motto with GI for a colonoscopy.  2)  Take care.  Glad to see you today.   Current Allergies (reviewed today): ! CIPRO

## 2010-02-19 NOTE — Progress Notes (Signed)
Summary: Work in today [employer] Note From Other Clinic   Caller: Aram Beecham [phone] Dr Crawford Givens [facility] Call For: Dr Arlyce Dice ( Doc of the day ) Summary of Call: Lower abd pain - no blood. Pain more severe today. Dr Para March would like pt seen today. Initial call taken by: Leanor Kail [facility],  October 31, 2009 9:49 AM  Follow-up for Phone Call        Scheduled with Aram Beecham patient to arrive at 1:15 Follow-up by: Darcey Nora RN, CGRN,  October 31, 2009 10:23 AM

## 2010-02-19 NOTE — Progress Notes (Signed)
  Phone Note Outgoing Call   Summary of Call: Please call over to GI and see what they can do about scheduling his colonoscopy.  Continues to have LLQ pain.  thanks.  Initial call taken by: Crawford Givens MD,  January 05, 2010 9:42 AM  Follow-up for Phone Call        Patient given the GI phone number and will call himself to set this back up. Follow-up by: Carlton Adam,  January 05, 2010 12:19 PM

## 2010-02-19 NOTE — Procedures (Signed)
Summary: Colonoscopy / [facility]  Colonoscopy / [facility]   Imported By: Lennie Odor 11/11/2009 09:57:52  _____________________________________________________________________  External Attachment:    Type:   Image     Comment:   External Document

## 2010-02-19 NOTE — Progress Notes (Signed)
  Phone Note Other Incoming   Request: Send information Summary of Call: Request for records received from Dr. Carman Ching. 2 pages forwarded to Mike Gip, PA for review.

## 2010-02-23 ENCOUNTER — Other Ambulatory Visit (AMBULATORY_SURGERY_CENTER): Payer: BC Managed Care – PPO | Admitting: Gastroenterology

## 2010-02-23 ENCOUNTER — Encounter: Payer: Self-pay | Admitting: Gastroenterology

## 2010-02-23 DIAGNOSIS — Z1211 Encounter for screening for malignant neoplasm of colon: Secondary | ICD-10-CM

## 2010-02-23 LAB — HM COLONOSCOPY

## 2010-02-25 ENCOUNTER — Ambulatory Visit
Admission: RE | Admit: 2010-02-25 | Discharge: 2010-02-25 | Disposition: A | Discharge status: Home / Self Care | Payer: BC Managed Care – PPO | Source: Ambulatory Visit | Attending: Neurosurgery | Admitting: Neurosurgery

## 2010-02-25 ENCOUNTER — Other Ambulatory Visit: Payer: Self-pay | Admitting: Neurosurgery

## 2010-02-25 DIAGNOSIS — M549 Dorsalgia, unspecified: Secondary | ICD-10-CM

## 2010-03-05 NOTE — Procedures (Signed)
Summary: Colonoscopy   Colonoscopy  Procedure date:  02/23/2010  Findings:      Location:  [facility].    Procedures Next Due Date:    Colonoscopy: 02/2020 COLONOSCOPY PROCEDURE REPORT  PATIENT:  Lucas Robinson, Lucas Robinson  MR#:  [id number] BIRTHDATE:   [birth year], 59 yrs. old   GENDER:   male ENDOSCOPIST:   Vania Rea. Jarold Motto, MD, [facility] REF. BY: Crawford Givens, M.D. PROCEDURE DATE:  02/23/2010 PROCEDURE:  Average-risk screening colonoscopy G0121 ASA CLASS:   Class II INDICATIONS: constipation, Routine Risk Screening  MEDICATIONS:    Fentanyl 75 mcg IV, Versed 7 mg IV  DESCRIPTION OF PROCEDURE:   After the risks benefits and alternatives of the procedure were thoroughly explained, informed consent was obtained.  Digital rectal exam was performed and revealed no abnormalities.   The LB [id number] endoscope was introduced through the anus and advanced to the cecum, which was identified by both the appendix and ileocecal valve, limited by a redundant colon.    The quality of the prep was excellent, using MoviPrep.  The instrument was then slowly withdrawn as the colon was fully examined. <<PROCEDUREIMAGES>>      <<OLD IMAGES>>  FINDINGS:  No polyps or cancers were seen.  Otherwise normal colonoscopy without other polyps, masses, vascular ectasias, or inflammatory changes.   Retroflexed views in the rectum revealed no abnormalities.    The scope was then withdrawn from the patient and the procedure completed.  COMPLICATIONS:   None ENDOSCOPIC IMPRESSION:  1) No polyps or cancers  2) Otherwise nl colonoscopy WMO RECOMMENDATIONS:  1) Continue current colorectal screening recommendations for "routine risk" patients with a repeat colonoscopy in 10 years.  2) metamucil or benefiber  3) high fiber diet REPEAT EXAM:   No   _______________________________ Vania Rea. Jarold Motto, MD, [facility]  CC:     Appended Document: Colonoscopy    Clinical Lists  Changes  Observations: Added new observation of PAST SURG HX: VASECTOMY ([facility]) 1979 FUSION C 5/6; C6/7 :(DR. Gerlene Fee) (05/18/2002) 2011 L4-5 fusion with post op infection requiring 2nd operation and vancomycin per PICC  COLONOSCOPY --WITHIN NORMAL WUJWJX:(9147), repeat 2012 wnl, consider repeat in 2022 MRI -- NECK SPINAL STENOSIS C 5/6/  C6/7 SPINDYL C 3/4; C4/5:(04/05/2002) EMG  ARM ([place]) 10/09 CERVICAL MRI C6-7 // SPURRING AND MILD STENOSIS ; DISC C6/7; C3/4:(12/29/2004) CERVICAL MRI AND CT SOME MILD CHANGES ([place]) ( 10/09) (02/23/2010 18:23)       Past History:  Past Surgical History: VASECTOMY ([facility]) 1979 FUSION C 5/6; C6/7 :(DR. Gerlene Fee) (05/18/2002) 2011 L4-5 fusion with post op infection requiring 2nd operation and vancomycin per PICC  COLONOSCOPY --WITHIN NORMAL WGNFAO:(1308), repeat 2012 wnl, consider repeat in 2022 MRI -- NECK SPINAL STENOSIS C 5/6/  C6/7 SPINDYL C 3/4; C4/5:(04/05/2002) EMG  ARM ([place]) 10/09 CERVICAL MRI C6-7 // SPURRING AND MILD STENOSIS ; DISC C6/7; C3/4:(12/29/2004) CERVICAL MRI AND CT SOME MILD CHANGES ([place]) ( 10/09)

## 2010-03-08 NOTE — Op Note (Signed)
Lucas Robinson, Lucas Robinson             ACCOUNT NO.:  [id number]  MEDICAL RECORD NO.:  [id number]          PATIENT TYPE:  INP  LOCATION:  3535                         FACILITY:  [facility]  PHYSICIAN:  Reinaldo Meeker, M.D. 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  PROCEDURE:  Left L4-5 hemilaminectomy with removal of scar tissue from a right L4-5 with evaluation of L4-5 fusion followed by removal of granulation of the tissue with decompression of right L4 and L5 nerve roots.  SURGEON:  Reinaldo Meeker, MD.  ASSISTANT:  Donalee Citrin, MD  PROCEDURE IN DETAIL:  After being placed in the prone position, the patient's back was prepped and draped in the usual sterile fashion. Previous lumbar incision was opened carried down the spinous processes. We then did subperiosteal dissection then along the left side of the spinous processes and lamina, facet joint.  We then dissected out laterally and soft tissue over the dura to identify the pedicle screw construct on the right between L4 and L5.  Placed a self-retaining retractor for exposure.  We took an x-ray to identify the exact lamina. We then with the spinous processes of L4 and did a left hemilaminectomy at L4 and L5 on the left thoroughly removed the superior to the L5 lamina.  We then were able to identify the dura on the left side and then we were able to walk her across the right side of the dura. Removed the scar tissue and inflammatory reaction tissue that had formed over the thecal sac.  As we got to the lateral recess on the right, we were able to go through the residual joint, identify the L4 nerve roots, dissected off soft tissue away from it.  Then began to explore the foramen, clean-out soft  tissue.  We did that we were able to evaluate more of the L4 nerve until was well decompressed.  Then turned our attention down towards the L5 nerve root.  More soft tissue from the foramen to reach the pedicle of the L5.  We were able to see the L5 nerve root going around it freely.  Removed some additional soft tissue from the midline, which gave excellent decompression of the thecal sac and L5 nerve root.  This time inspection was carried out, the L4-L5 roots were both found to be well free _________ foramen without difficulty.  Irrigated copiously.  We checked the fusion at L4-5 and instrumentation appeared to be solid as the interbody device.  At this time, we felt we had accomplished what we could as there was excellent relief of this stenosis and lateral recess stenosis at L4-5 on the right and removal of the soft tissue and causing marked compression of the L4- L5 nerve roots.  At this time, large amounts of irrigation carried out once more and the bleeding controlled with bipolar coagulation and Gelfoam.  Hemovac drain was brought through a separate stab incision left the epidural  space.  It was then closed in multiple layers of Vicryl in the muscle fascia, subcutaneous subcuticular tissues and staples were placed on the skin. Sterile dressing was applied.  The patient was extubated, and taken to the recovery room in stable condition.          ______________________________ Reinaldo Meeker, M.D.     ROK/MEDQ  D:  01/15/2010  T:  01/16/2010  Job:  [id number]  Electronically Signed by Aliene Beams M.D. on 03/08/2010 10:47:00 PM

## 2010-03-17 ENCOUNTER — Other Ambulatory Visit: Payer: Self-pay | Admitting: Neurosurgery

## 2010-03-17 DIAGNOSIS — M545 Low back pain: Secondary | ICD-10-CM

## 2010-03-21 ENCOUNTER — Ambulatory Visit
Admission: RE | Admit: 2010-03-21 | Discharge: 2010-03-21 | Disposition: A | Discharge status: Home / Self Care | Payer: BC Managed Care – PPO | Source: Ambulatory Visit | Attending: Neurosurgery | Admitting: Neurosurgery

## 2010-03-21 DIAGNOSIS — M545 Low back pain: Secondary | ICD-10-CM

## 2010-03-21 MED ORDER — GADOBENATE DIMEGLUMINE 529 MG/ML IV SOLN
15.0000 mL | Freq: Once | INTRAVENOUS | Status: AC | PRN
  Administered 2010-03-21: 15 mL via INTRAVENOUS

## 2010-03-30 LAB — BASIC METABOLIC PANEL
CO2: 28 mEq/L (ref 19–32)
Calcium: 9.5 mg/dL (ref 8.4–10.5)
Chloride: 103 mEq/L (ref 96–112)
Glucose, Bld: 92 mg/dL (ref 70–99)
Potassium: 4.4 mEq/L (ref 3.5–5.1)
Sodium: 138 mEq/L (ref 135–145)

## 2010-03-30 LAB — CBC
HCT: 42.4 % (ref 39.0–52.0)
Hemoglobin: 13.6 g/dL (ref 13.0–17.0)
MCH: 26.7 pg (ref 26.0–34.0)
MCHC: 32.1 g/dL (ref 30.0–36.0)
MCV: 83.1 fL (ref 78.0–100.0)

## 2010-03-30 LAB — WOUND CULTURE: Culture: NO GROWTH

## 2010-03-30 LAB — ANAEROBIC CULTURE

## 2010-04-02 LAB — CBC
Hemoglobin: 14 g/dL (ref 13.0–17.0)
MCHC: 34.1 g/dL (ref 30.0–36.0)
Platelets: 205 10*3/uL (ref 150–400)
RBC: 4.74 MIL/uL (ref 4.22–5.81)
RBC: 5.25 MIL/uL (ref 4.22–5.81)
WBC: 8.4 10*3/uL (ref 4.0–10.5)
WBC: 7.9 10*3/uL (ref 4.0–10.5)

## 2010-04-02 LAB — WOUND CULTURE

## 2010-04-02 LAB — ANAEROBIC CULTURE: Gram Stain: NONE SEEN

## 2010-04-02 LAB — TYPE AND SCREEN
ABO/RH(D): A POS
Antibody Screen: NEGATIVE

## 2010-04-02 LAB — BASIC METABOLIC PANEL
Calcium: 8.8 mg/dL (ref 8.4–10.5)
GFR calc non Af Amer: >60 mL/min (ref >60)
Glucose, Bld: 96 mg/dL (ref 70–99)
Sodium: 133 mEq/L — ABNORMAL LOW (ref 135–145)

## 2010-04-02 LAB — SURGICAL PCR SCREEN: Staphylococcus aureus: POSITIVE — AB

## 2010-04-02 LAB — TISSUE CULTURE

## 2010-04-02 LAB — SEDIMENTATION RATE: Sed Rate: 49 mm/hr — ABNORMAL HIGH (ref 0–16)

## 2010-04-14 ENCOUNTER — Telehealth: Payer: Self-pay | Admitting: *Deleted

## 2010-04-14 NOTE — Telephone Encounter (Signed)
Patient says that he has been having a hard time with his allergies, he is asking if he can get something called in for it. He says that Dr. Hetty Ely would do this every spring for him.  Uses [facility] [address]

## 2010-04-14 NOTE — Telephone Encounter (Signed)
Patient notified as instructed by telephone. Was informed by patient that he already has Flonase and will try the other suggestions and will call back if this does not work.

## 2010-04-14 NOTE — Telephone Encounter (Signed)
Please call in flonase 2 sprays per nostril per day, #1, 5rf.  Have him use that along with nasal saline and otc claritin 10mg  a day.  Thanks.

## 2010-05-27 ENCOUNTER — Ambulatory Visit
Admission: RE | Admit: 2010-05-27 | Discharge: 2010-05-27 | Disposition: A | Discharge status: Home / Self Care | Payer: BC Managed Care – PPO | Source: Ambulatory Visit | Attending: Neurosurgery | Admitting: Neurosurgery

## 2010-05-27 ENCOUNTER — Other Ambulatory Visit: Payer: Self-pay | Admitting: Neurosurgery

## 2010-05-27 DIAGNOSIS — IMO0002 Reserved for concepts with insufficient information to code with codable children: Secondary | ICD-10-CM

## 2010-05-27 DIAGNOSIS — M5126 Other intervertebral disc displacement, lumbar region: Secondary | ICD-10-CM

## 2010-06-05 NOTE — Op Note (Signed)
NAME:  DEKARI, BURES                       ACCOUNT NO.:  [id number]   MEDICAL RECORD NO.:  [id number]                   PATIENT TYPE:  OIB   LOCATION:  2872                                 FACILITY:  [facility]   PHYSICIAN:  Reinaldo Meeker, M.D.              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   PROCEDURE:  C5-6, C6-7 anterior cervical diskectomy with bone bank fusion  followed by Atlantis inferior cervical plating with operative microscope.   SURGEON:  Reinaldo Meeker, M.D.   ASSISTANT:  Dr. Rocky Link __________.   PROCEDURE IN DETAIL:  After being placed in the supine position and 5 pounds  of halter traction, the patient's neck was prepped and draped in the usual  sterile fashion.  A localizing x-ray was taken prior to incision to identify  the appropriate level.  Transverse incision was made in the right anterior  neck starting at the midline and approaching the medial aspect of the  sternocleidomastoid muscle.  The platysma muscle was then incised  transversely.  The natural fascial planes with the strap muscles medially  and the sternocleidomastoid laterally to divide down to the anterior aspect  of the cervical spine.  Longus colli muscles were identified and split in  the midline, superiorly-bilaterally with the __________ resector.  Self-  retaining retractor was placed for exposure.  A second anterior cervical  approach to the appropriate levels.  Using the 15 blade, the disk at C5-6  and C6-7 was removed.  Bony overgrowth was removed with a 2 and 3 mm  Kerrison punch, and a high-speed drill was used to wipe the interspace and  start to remove the marked  spondylotic bony overgrowth that had fairly much  replaced the disk.  When approximately 80% of the disk space had been  traveled down towards the spinal dura, the microscope was draped and brought  into the field and used for the remainder of the case.  Using  microdissection technique, high-speed drill was used to remove the remainder  of the deep vertebral bodies and any residual disk that was there.  When the  edges of the vertebral body had been encountered, residual bone and disk  material were removed in a piecemeal fashion along the posterior  longitudinal ligament, which gave excellent decompression of the underlying  spinal dura at C5-6.  Proximal foraminal decompression was carried out with  vertebral tension being scoped further out onto the foramen at C5-6 on the  left symptomatic side.  When the C6 foramen was well decompressed,  inspection was carried out in all directions at this level for any evidence  of residual compression; none could be identified.  A similar procedure was  carried out at C6-7, once again removing the remainder of the disk material  and bony overgrowth down the posterior longitudinal ligament which was then  again removed in a piecemeal fashion.  Once again, generous decompression  was carried out towards the left symptomatic side.  Decompression was  carried out towards the right until the proximal C7 nerve root could be  identified.  At this point, inspection was carried out at both levels in  both directions for any evidence of residual compression, and none could be  identified.  Large amounts of irrigation were carried out. Active bleeding  was controlled with bipolar coagulation and Gelfoam.  Measurements were  taken and a 6 and 7 mm bone bank plug reconstituted.  After drilling once  more, the plugs were then impacted with a 6-mm plug going in C5-6 and a 7-mm  plug going into C6-7.  Fluoroscopy showed the plugs to be in good position.  A 40 mm  Atlantis anterior cervical plate was then chosen.  Under  fluoroscopic guidance, drill holes were placed followed by tapping and  placement of 13-mm screws x6. Final fluoroscopy showed the plate screws in,  hooks all to be in good position.  Large amounts of irrigation were carried  out at this time and any  bleeding controlled with bipolar coagulation.  The wounds were then closed  using interrupted Vicryl on the platysma muscle, inverted 5-0 PDS in the  subcuticular layer, Steri-Strips on the skin.  A sterile dressing was  applied and the patient was extubated and taken to the recovery room in  stable condition.                                                Reinaldo Meeker, M.D.    ROK/MEDQ  D:  05/18/2002  T:  05/18/2002  Job:  [id number]

## 2010-06-30 ENCOUNTER — Encounter: Payer: Self-pay | Admitting: Family Medicine

## 2010-07-01 ENCOUNTER — Encounter: Payer: Self-pay | Admitting: Family Medicine

## 2010-07-01 ENCOUNTER — Ambulatory Visit (INDEPENDENT_AMBULATORY_CARE_PROVIDER_SITE_OTHER): Payer: BC Managed Care – PPO | Admitting: Family Medicine

## 2010-07-01 VITALS — BP 130/92 | HR 60 | Temp 98.7°F | Wt 195.5 lb

## 2010-07-01 DIAGNOSIS — J309 Allergic rhinitis, unspecified: Secondary | ICD-10-CM

## 2010-07-01 DIAGNOSIS — J029 Acute pharyngitis, unspecified: Secondary | ICD-10-CM

## 2010-07-01 DIAGNOSIS — J020 Streptococcal pharyngitis: Secondary | ICD-10-CM

## 2010-07-01 MED ORDER — AMOXICILLIN 875 MG PO TABS: 875.0000 mg | ORAL_TABLET | Freq: Two times a day (BID) | ORAL | Status: AC

## 2010-07-01 MED ORDER — AZELASTINE HCL 0.1 % NA SOLN: 2.0000 | Freq: Two times a day (BID) | NASAL | Status: DC

## 2010-07-01 NOTE — Patient Instructions (Signed)
I would start the amoxil today and use the astelin for your nose/sinuses.  Let me know if you aren't getting better.  Rest and fluids in the meantime.

## 2010-07-01 NOTE — Progress Notes (Signed)
duration of symptoms: 1 week Rhinorrhea: occ post nasal gtt, since the spring, prev improved with astelin (off this now) Congestion: some ear pain: no sore throat:yes Cough: no myalgias:no other concerns: + fatigued.  Voice change noted.  Sleep is disrupted.  Fever- no.    ROS: See HPI.  Otherwise negative.    Meds, vitals, and allergies reviewed.   GEN: nad, alert and oriented HEENT: mucous membranes moist, TM w/o erythema, nasal epithelium not injected, OP with cobblestoning and erythema, no exudates NECK: supple w/o LA CV: rrr. PULM: ctab, no inc wob ABD: soft, +bs EXT: no edema  RST positive in clinic.

## 2010-07-02 DIAGNOSIS — J020 Streptococcal pharyngitis: Secondary | ICD-10-CM | POA: Insufficient documentation

## 2010-07-02 NOTE — Assessment & Plan Note (Signed)
Restart astelin and f/u prn.

## 2010-07-02 NOTE — Assessment & Plan Note (Signed)
Start amoxil, rest/fluids and f/u prn.  Nontoxic.  D/w pt.  He agreed.

## 2010-07-10 ENCOUNTER — Ambulatory Visit: Payer: BC Managed Care – PPO | Admitting: Gastroenterology

## 2010-09-24 ENCOUNTER — Telehealth: Payer: Self-pay | Admitting: Family Medicine

## 2010-09-24 ENCOUNTER — Ambulatory Visit (INDEPENDENT_AMBULATORY_CARE_PROVIDER_SITE_OTHER): Payer: BC Managed Care – PPO | Admitting: Family Medicine

## 2010-09-24 ENCOUNTER — Encounter: Payer: Self-pay | Admitting: Family Medicine

## 2010-09-24 VITALS — BP 112/82 | HR 86 | Temp 98.6°F | Wt 199.1 lb

## 2010-09-24 DIAGNOSIS — R300 Dysuria: Secondary | ICD-10-CM

## 2010-09-24 DIAGNOSIS — R972 Elevated prostate specific antigen [PSA]: Secondary | ICD-10-CM | POA: Insufficient documentation

## 2010-09-24 DIAGNOSIS — L989 Disorder of the skin and subcutaneous tissue, unspecified: Secondary | ICD-10-CM

## 2010-09-24 DIAGNOSIS — N400 Enlarged prostate without lower urinary tract symptoms: Secondary | ICD-10-CM | POA: Insufficient documentation

## 2010-09-24 LAB — POCT URINALYSIS DIPSTICK
Glucose, UA: NEGATIVE
Leukocytes, UA: NEGATIVE
Nitrite, UA: NEGATIVE
Protein, UA: NEGATIVE
Urobilinogen, UA: NEGATIVE

## 2010-09-24 LAB — PSA: PSA: 6.06 ng/mL — ABNORMAL HIGH (ref 0.10–4.00)

## 2010-09-24 MED ORDER — TAMSULOSIN HCL 0.4 MG PO CAPS: 0.4000 mg | ORAL_CAPSULE | Freq: Every day | ORAL | Status: DC

## 2010-09-24 NOTE — Progress Notes (Signed)
Some minimal burning with urination and slow stream, esp in AM.  Noted more recently and hadn't improved on it's own.  Change in stream has been going on for years, along with frequency.  No FH prostate cancer.   3 day history of lesion in R axilla  It's been a tough summer with allergies, but he is still on astelin with some relief.    Back is doing reasonably well, given his history.    Meds, vitals, and allergies reviewed.   ROS: See HPI.  Otherwise, noncontributory.  U/a unremarkable.    nad ncat Tm wnl Nasal exam mildly stuffy Op wnl Neck supple, no LA R axilla with minimal irritation (recently changed deoderants) with small <<1cm mass (likely isolated lymph node) in R axilla.  No other LA along clavicle Ext genitalia wnl, no acute change (enlarged L hemiscrotum transilluminates normally and isn't changed per pt) Prostate gland firm and smooth, sym enlargement 2-3+, but no nodularity, tenderness, mass, asymmetry or induration.

## 2010-09-24 NOTE — Assessment & Plan Note (Signed)
He'll monitor the axillary changes, present only for about 3 days, and f/u prn.

## 2010-09-24 NOTE — Assessment & Plan Note (Signed)
Check PSA, start flomax and f/u prn.  He agrees.

## 2010-09-24 NOTE — Telephone Encounter (Signed)
Patient's wife advised.  Lab appt scheduled 10/19/2010.

## 2010-09-24 NOTE — Telephone Encounter (Signed)
Please call pt.  PSA is elevated at 6 (this may be related to prostate irritation/inflammation with his current symptoms).  I wouldn't do anything now other than take the flomax.  This is higher than prev, so I would recheck in 1 month.  If still elevated, then I would refer him over to urology.  Thanks.  Orders are in.

## 2010-09-24 NOTE — Patient Instructions (Signed)
Start the flomax, 1 a day and let us know if that doesn't help.  You can get your results through our phone system.  Follow the instructions on the blue card. Let us know if the spot in your R armpit doesn't get better gradually.  You may need to change back to your old deoderant.

## 2010-10-13 ENCOUNTER — Ambulatory Visit (INDEPENDENT_AMBULATORY_CARE_PROVIDER_SITE_OTHER): Payer: BC Managed Care – PPO | Admitting: Family Medicine

## 2010-10-13 ENCOUNTER — Encounter: Payer: Self-pay | Admitting: Family Medicine

## 2010-10-13 VITALS — BP 120/82 | HR 60 | Temp 98.8°F | Wt 201.8 lb

## 2010-10-13 DIAGNOSIS — N400 Enlarged prostate without lower urinary tract symptoms: Secondary | ICD-10-CM

## 2010-10-13 DIAGNOSIS — N419 Inflammatory disease of prostate, unspecified: Secondary | ICD-10-CM

## 2010-10-13 LAB — POCT URINALYSIS DIPSTICK
Blood, UA: NEGATIVE
Ketones, UA: NEGATIVE
Spec Grav, UA: 1.005
Urobilinogen, UA: 0.2

## 2010-10-13 MED ORDER — SULFAMETHOXAZOLE-TRIMETHOPRIM 800-160 MG PO TABS: 1.0000 | ORAL_TABLET | Freq: Two times a day (BID) | ORAL | Status: AC

## 2010-10-13 NOTE — Assessment & Plan Note (Signed)
Flow improved on flomax.

## 2010-10-13 NOTE — Progress Notes (Signed)
H/o BPH.  His flow is better on flomax. He had a HA with it initially, but this is better. He has some dry mouth with it, but is tolerating this.  Sunday night he woke with pain similar to prev prostatitis.  He'll get occ pain (but not burning) down the shaft of the penis. Now with generalized soreness in the groin and perineum ("like I've been riding a saddle").  Pain is worse sitting.  No fevers.  Intolerant of cipro.    Meds, vitals, and allergies reviewed.   ROS: See HPI.  Otherwise, noncontributory.  nad ncat External genitalia w/o acute changes, L hemiscrotum enlarged at baseline Rectal exam with enlarged and tender prostate.

## 2010-10-13 NOTE — Assessment & Plan Note (Signed)
I didn't recheck PSA given the sx.  Will refer to uro as this is recurrent for him.  Start septra in meantime given the cipro allergy.  He agrees.  Nontoxic and okay for outpatient f/u.  We can recheck PSA here in clinic if not rechecked by uro.  He agrees with plan.

## 2010-10-13 NOTE — Patient Instructions (Signed)
We'll get your urine culture back and let you know about it.  Start the septra in the meantime and see Shirlee Limerick about your referral before your leave today.  Take care.

## 2010-10-15 LAB — URINE CULTURE: Colony Count: NO GROWTH

## 2010-10-19 ENCOUNTER — Other Ambulatory Visit: Payer: BC Managed Care – PPO

## 2010-10-19 ENCOUNTER — Ambulatory Visit: Payer: BC Managed Care – PPO | Admitting: Family Medicine

## 2010-11-05 ENCOUNTER — Encounter: Payer: Self-pay | Admitting: Family Medicine

## 2010-12-17 ENCOUNTER — Ambulatory Visit (INDEPENDENT_AMBULATORY_CARE_PROVIDER_SITE_OTHER): Payer: BC Managed Care – PPO | Admitting: Family Medicine

## 2010-12-17 ENCOUNTER — Encounter: Payer: Self-pay | Admitting: Family Medicine

## 2010-12-17 VITALS — BP 124/80 | HR 60 | Temp 98.3°F | Wt 204.0 lb

## 2010-12-17 DIAGNOSIS — J029 Acute pharyngitis, unspecified: Secondary | ICD-10-CM

## 2010-12-17 DIAGNOSIS — J069 Acute upper respiratory infection, unspecified: Secondary | ICD-10-CM

## 2010-12-17 LAB — POCT RAPID STREP A (OFFICE): Rapid Strep A Screen: NEGATIVE

## 2010-12-17 NOTE — Assessment & Plan Note (Signed)
Likely viral, nontoxic, supporitive tx and f/u prn.

## 2010-12-17 NOTE — Progress Notes (Signed)
duration of symptoms: <1 week Known sick contacts Rhinorrhea: yes Congestion: yes ear pain: mild sore throat:yes, but some better Cough: yes, but some better Myalgias: yes, back ache but better now other concerns: no fevers Diarrhea over last few days, better now, no blood in stool Fatigue noted Voice change noted  RST neg  ROS: See HPI.  Otherwise negative.    Meds, vitals, and allergies reviewed.   GEN: nad, alert and oriented HEENT: mucous membranes moist, TM w/o erythema, nasal epithelium injected, OP with cobblestoning NECK: supple w/o LA CV: rrr. PULM: ctab, no inc wob ABD: soft, +bs EXT: no edema

## 2010-12-17 NOTE — Patient Instructions (Signed)
Drink plenty of fluids, take tylenol as needed, and gargle with warm salt water for your throat.  This should gradually improve.  Take care.  Let us know if you have other concerns.  Try to rest your voice.

## 2011-02-17 ENCOUNTER — Ambulatory Visit (INDEPENDENT_AMBULATORY_CARE_PROVIDER_SITE_OTHER): Payer: BC Managed Care – PPO | Admitting: Family Medicine

## 2011-02-17 ENCOUNTER — Encounter: Payer: Self-pay | Admitting: Family Medicine

## 2011-02-17 DIAGNOSIS — G4700 Insomnia, unspecified: Secondary | ICD-10-CM

## 2011-02-17 DIAGNOSIS — J309 Allergic rhinitis, unspecified: Secondary | ICD-10-CM

## 2011-02-17 DIAGNOSIS — N401 Enlarged prostate with lower urinary tract symptoms: Secondary | ICD-10-CM

## 2011-02-17 MED ORDER — AMOXICILLIN 875 MG PO TABS: 875.0000 mg | ORAL_TABLET | Freq: Two times a day (BID) | ORAL | Status: AC

## 2011-02-17 MED ORDER — FLUTICASONE PROPIONATE 50 MCG/ACT NA SUSP: 2.0000 | Freq: Every day | NASAL | Status: DC

## 2011-02-17 MED ORDER — MOMETASONE FUROATE 0.1 % EX CREA: 1.0000 "application " | TOPICAL_CREAM | Freq: Every day | CUTANEOUS | Status: DC | PRN

## 2011-02-17 NOTE — Assessment & Plan Note (Signed)
Improved with dec in caffeine

## 2011-02-17 NOTE — Progress Notes (Signed)
"  I've kept a cold all winter."  Sick contacts at home.  No fevers.  Bloody rhinorrhea on L side.  Neti pot helps some with congestion.  Discolored rhinorrhea.  On astelin already, helps with allergic sx.  No cough now, he did have a cough with a cold a few months ago but it resolved.  Some facial pressure.  He had tried flonase before, but not consistently.    He cut way back on coffee/caffeine and cut out antihistamines and the urinary sx got better.  Repeat PSA was lower per patient at [facility].   Meds, vitals, and allergies reviewed.   ROS: See HPI.  Otherwise, noncontributory.  GEN: nad, alert and oriented HEENT: mucous membranes moist, tm w/o erythema, nasal exam w/o erythema but L nostril with small adherent medial scab, clear discharge noted,  OP with cobblestoning, frontal > max sinus tenderness NECK: supple w/o LA CV: rrr.   PULM: ctab, no inc wob EXT: no edema SKIN: no acute rash

## 2011-02-17 NOTE — Assessment & Plan Note (Signed)
Persistent sx.  This could be from environmental trigger at work.  Will add on flonase.  He could have concurrent bacterial process. We talked about trial of flonase and/or abx.  We talked about nosebleed potential.  Try flonase and then add amoxil if not better.  He agrees with plan.

## 2011-02-17 NOTE — Patient Instructions (Signed)
Don't change your regular meds.  Add on the flonase and see if that helps.  If not, try the amoxil.

## 2011-03-05 ENCOUNTER — Telehealth: Payer: Self-pay | Admitting: Family Medicine

## 2011-03-05 NOTE — Telephone Encounter (Signed)
I called pt x3 and LMOVM.  I had printed rx for amoxil at OV prev.  I would like to talk to patient before changing anything else.  I left message for him to call back to the answering service and ask them to contact me over the weekend (as that is the best way to get in touch with me).

## 2011-03-08 ENCOUNTER — Telehealth: Payer: Self-pay | Admitting: Family Medicine

## 2011-03-08 ENCOUNTER — Telehealth: Payer: Self-pay | Admitting: *Deleted

## 2011-03-08 MED ORDER — AMOXICILLIN 875 MG PO TABS: 875.0000 mg | ORAL_TABLET | Freq: Two times a day (BID) | ORAL | Status: AC

## 2011-03-08 NOTE — Telephone Encounter (Deleted)
Still getting blood out of left side.  Used spray.

## 2011-03-08 NOTE — Telephone Encounter (Signed)
Please resend. Thanks.

## 2011-03-08 NOTE — Telephone Encounter (Signed)
Patient says he was given a Rx at his last OV and told to try some other things before filling the Rx.  He has used the spray that he was given.   He says his sinuses have waxed and waned and now when he uses his Netti-Pot he gets blood from the left side.  He thinks he needs to get the Rx but has lost it.  He is [facility] [place].  Is it okay that I send the Rx?

## 2011-03-08 NOTE — Telephone Encounter (Signed)
I called pt again, at home and work.  LMOVM both places.  I asked him to call back.  Will await return call.

## 2011-03-19 ENCOUNTER — Telehealth: Payer: Self-pay | Admitting: *Deleted

## 2011-03-19 MED ORDER — AZITHROMYCIN 250 MG PO TABS: ORAL_TABLET | ORAL | Status: AC

## 2011-03-19 NOTE — Telephone Encounter (Signed)
Patient called stating that he finished the Amoxicillin today and he continues to feel bad.  He complains of headache, pain/pressure above the brow, and bloody mucous when he blows his nose.  No fever.  He wants to know if she should get another round of Amoxicillin, have another medication called in, or be referred to and ENT?  Please advise.  [facility].

## 2011-03-19 NOTE — Telephone Encounter (Signed)
He continues to have bloody discharge and some facial pain.  He was initially improving.  No fevers.  Still with neti pot use and flonase/astelin.  Will give a course of zmax and he'll call back if not improved.  He may need ENT eval.

## 2011-03-26 ENCOUNTER — Telehealth: Payer: Self-pay | Admitting: *Deleted

## 2011-03-26 DIAGNOSIS — J329 Chronic sinusitis, unspecified: Secondary | ICD-10-CM

## 2011-03-26 NOTE — Telephone Encounter (Signed)
Patient called to let Dr. Para March know that he thinks he needs an ENT referral for sinus evaluation.  He has finished the zpak and he continues to have bleeding and congestion in his nose.  Please advise.

## 2011-03-26 NOTE — Telephone Encounter (Signed)
Referral ordered. Please notify pt. Thanks!

## 2011-03-26 NOTE — Telephone Encounter (Signed)
Patient advised.

## 2011-05-26 IMAGING — RF DG LUMBAR SPINE 2-3V
1 series · 2 of 2 positions shown · non-contrast
Comparison: Lumbar myelogram 06/04/2009

CLINICAL DATA: Right L3-4 microdiskectomy, L4-5 fusion.

LUMBAR SPINE - 2-3 VIEW

[Series 1: run · 2 of 2 slices shown]
[im 1/2]
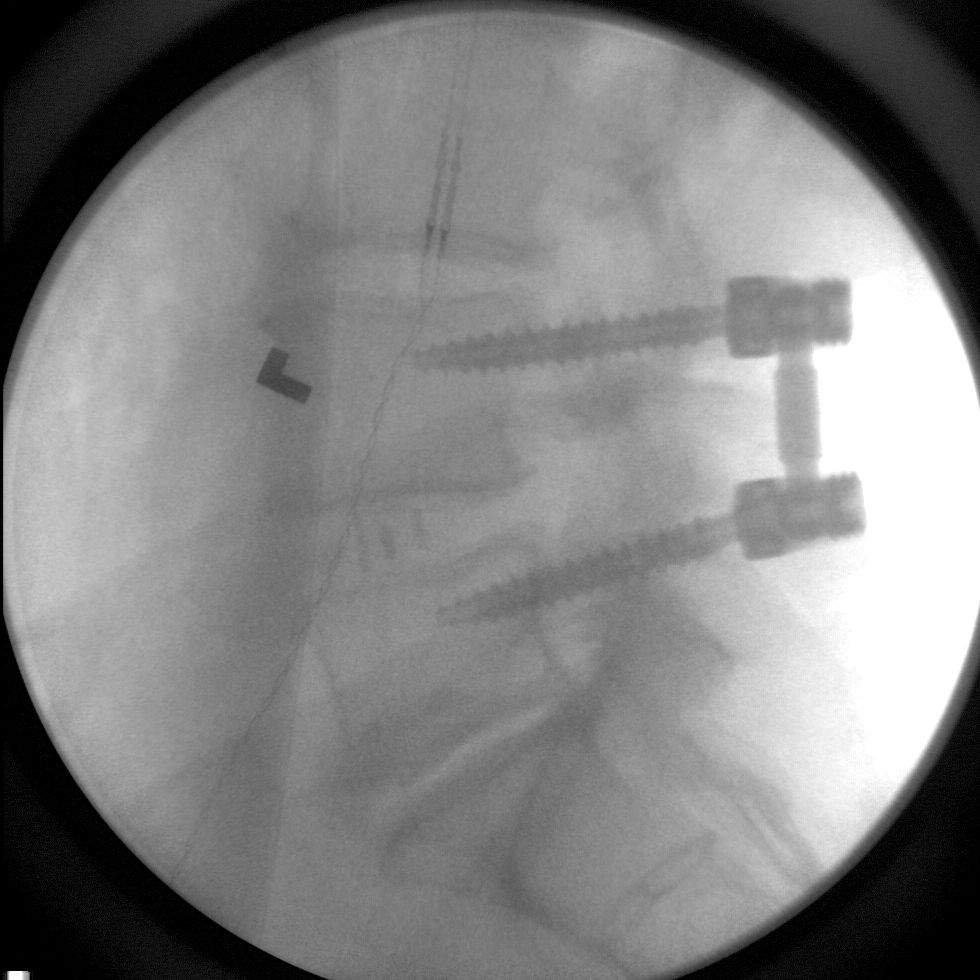
[im 2/2]
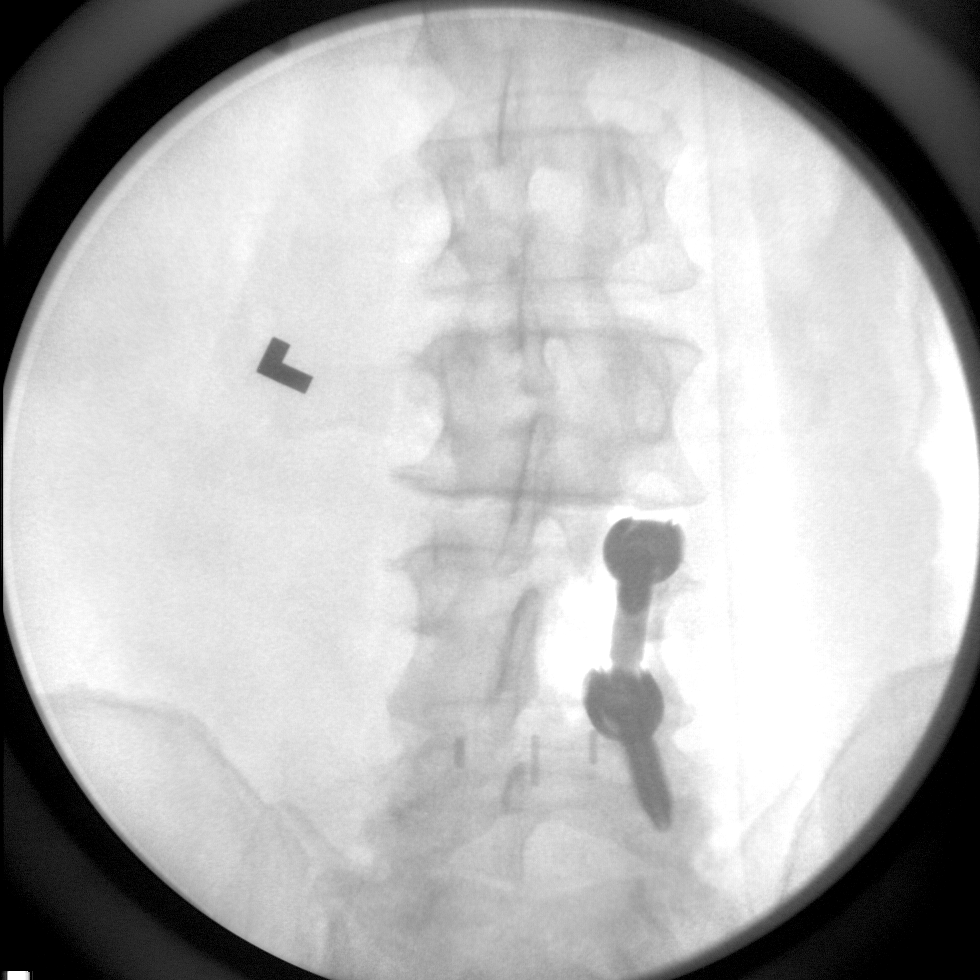

[2 of 2 positions shown; findings below may reference images not displayed]

FINDINGS: Two intraoperative spot images demonstrate changes of
posterior fusion at L4-5.  Unilateral facet screws are seen at L4-
5.  Normal alignment.  No hardware or bony complicating feature.
IMPRESSION: Posterior fusion at L4-5.

## 2011-08-04 ENCOUNTER — Other Ambulatory Visit: Payer: Self-pay | Admitting: Family Medicine

## 2011-08-04 NOTE — Telephone Encounter (Signed)
Received refill request electronically from pharmacy. Last office visit was 02/17/11. Is it okay to refill medication?

## 2011-08-04 NOTE — Telephone Encounter (Signed)
Sent!

## 2011-09-05 IMAGING — RF DG FLUORO GUIDE NDL PLC/BX
6 series · 6 of 6 positions shown · non-contrast
Comparison: none

CLINICAL DATA: BACK PAIN/FLUID COLLECTION AT L4/5 SUSPICIOUS FOR
INFECTION;

L4-L5 DISC SPACE ASPIRATION
TECHNIQUE: The risks and benefits of the procedure were discussed
with the patient, including infection, pain, bleeding, and spinal
fluid leak.

[Series 1: vertebral bx · 1 of 1 slices shown (1 of 6)]
[im 1/1]
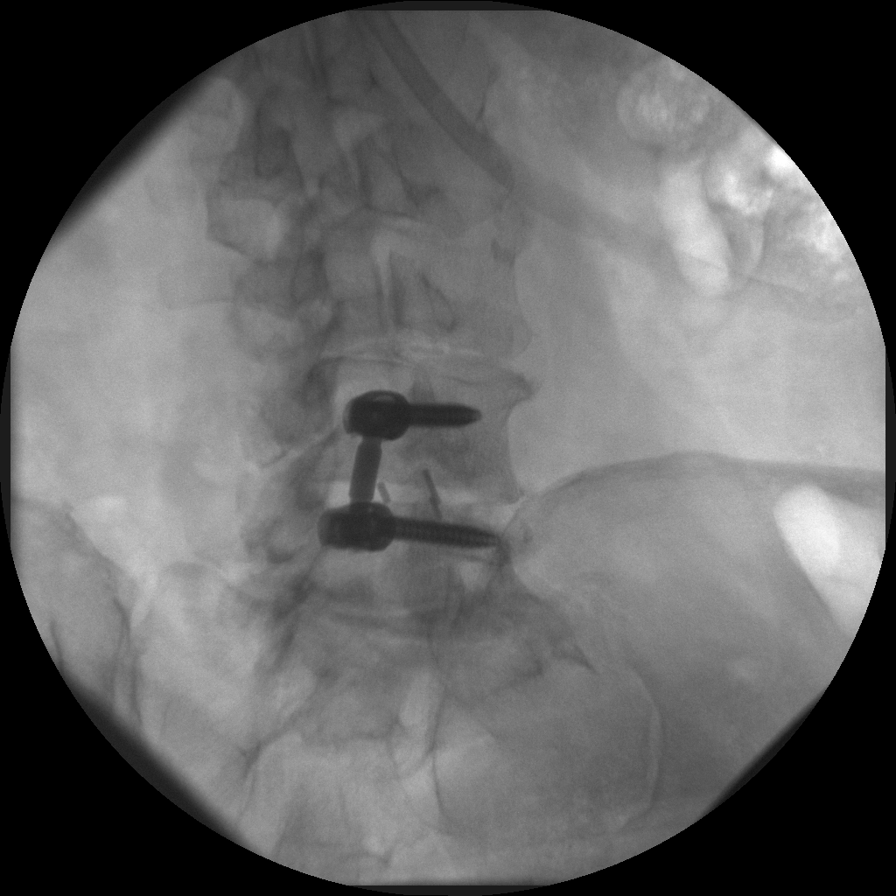

[Series 2: vertebral bx · 1 of 1 slices shown (2 of 6)]
[im 1/1]
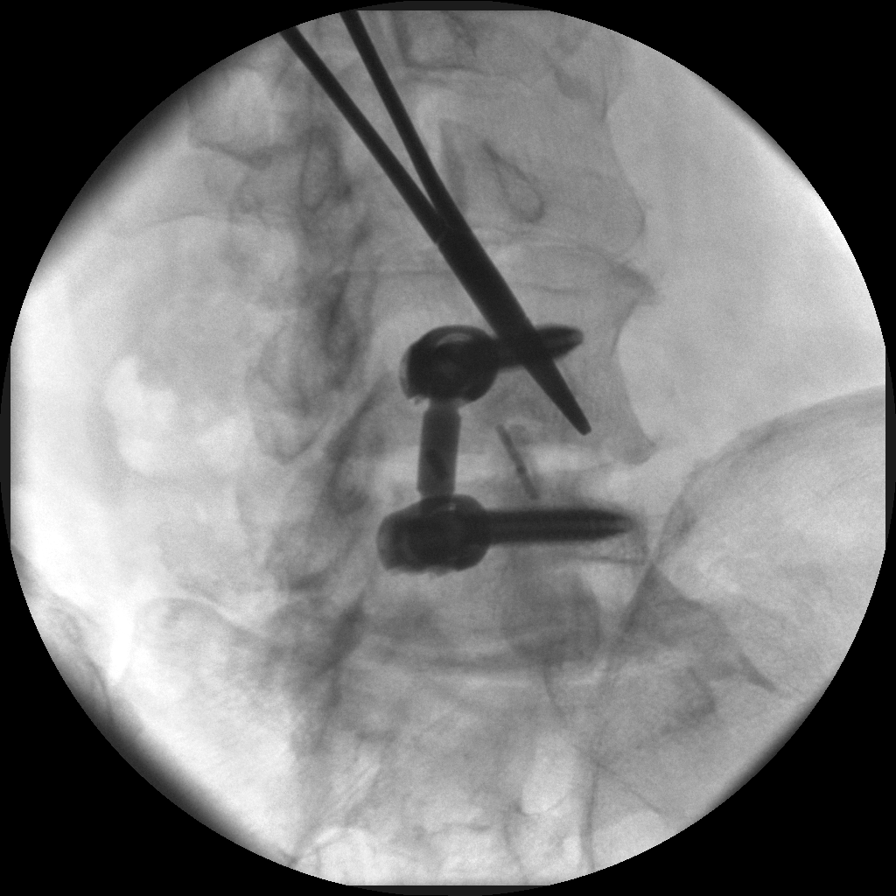

[Series 3: vertebral bx · 1 of 1 slices shown (3 of 6)]
[im 1/1]
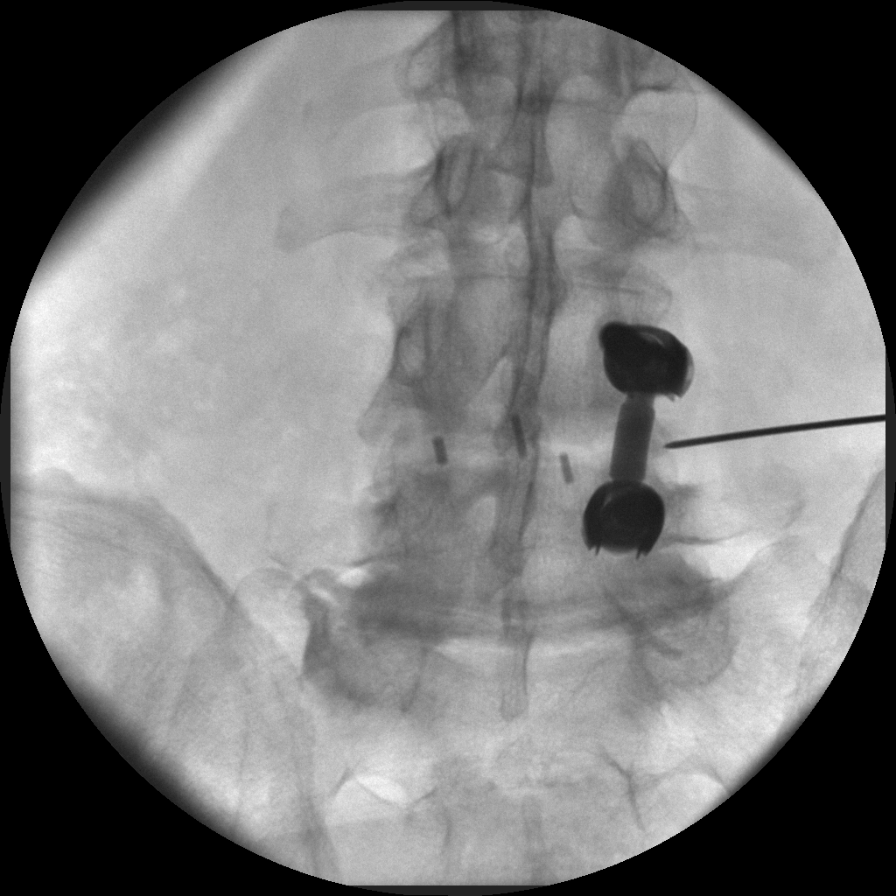

[Series 4: vertebral bx · 1 of 1 slices shown (4 of 6)]
[im 1/1]
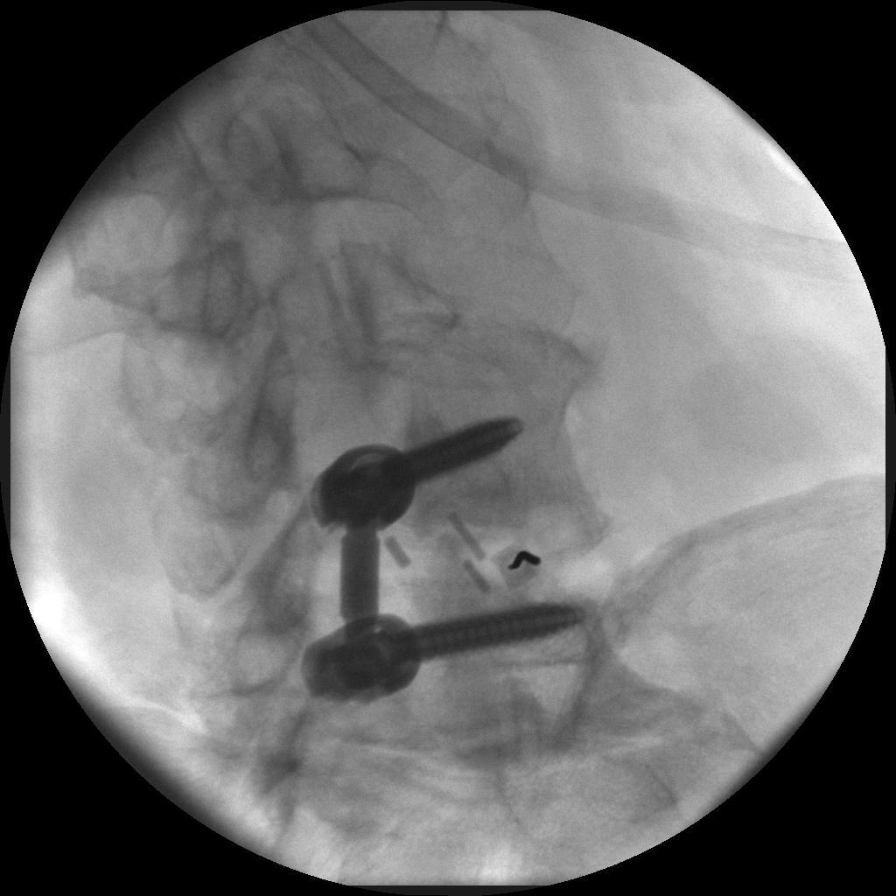

[Series 5: vertebral bx · 1 of 1 slices shown (5 of 6)]
[im 1/1]
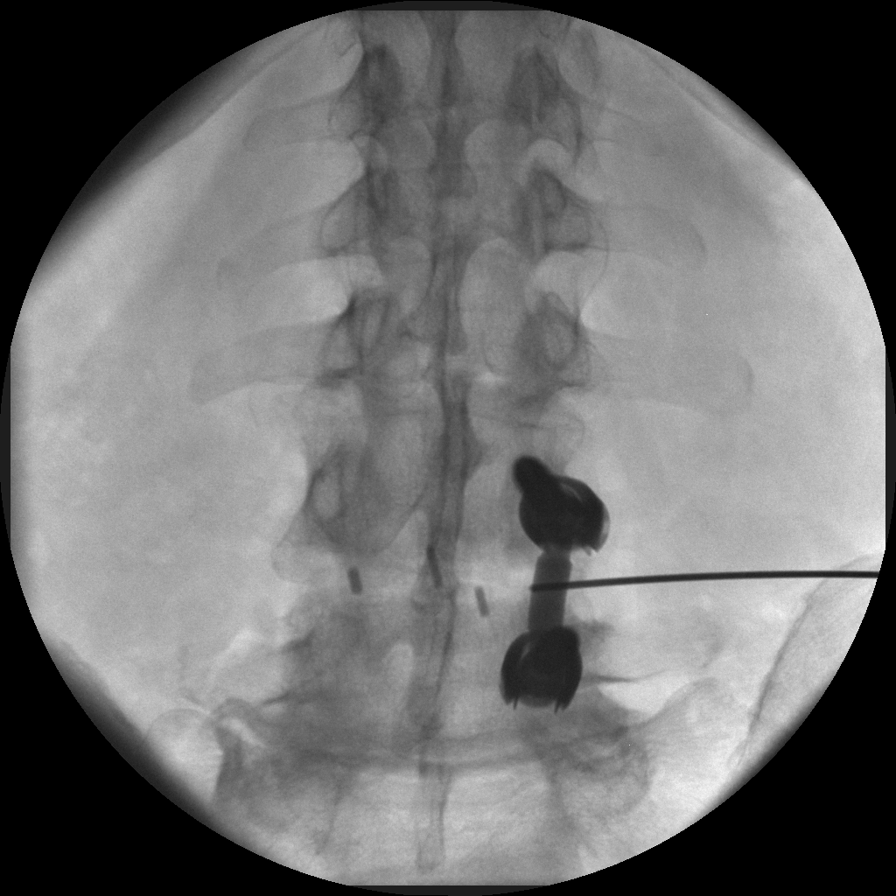

[Series 6: vertebral bx · 1 of 1 slices shown (6 of 6)]
[im 1/1]
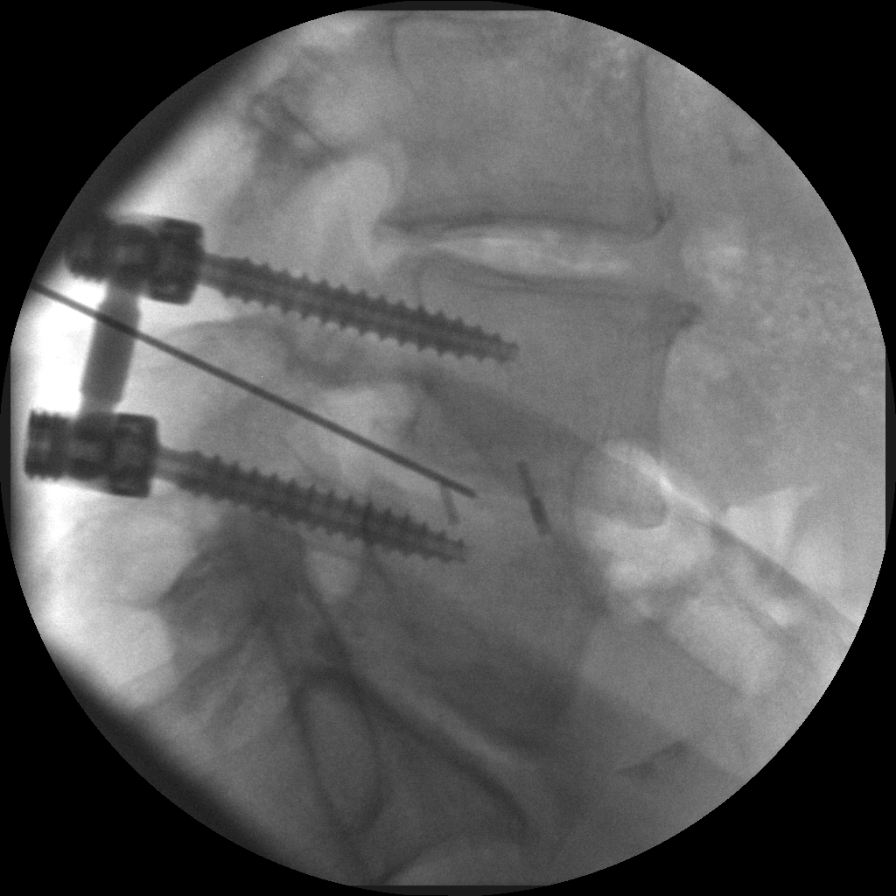

[6 of 6 positions shown; findings below may reference images not displayed]

All elements of maximum barrier sterile technique (cap and mask,
sterile gown, sterile gloves, large sterile sheet, hand hygiene,
and betadine scrub) were carried out.

Fluoroscopy Time:  55 seconds.

Medications:  Versed 4 mg.  Fentanyl 175 mcg.  Toradol 30 mg.

Conscious sedation time:  25 minutes.

Comparison studies: MRI lumbar spine 10/18/2009 and 12/16/2009.
FINDINGS: A right paraspinous approach was taken using an 18 gauge,
15 cm trocar.  The skin, subcutaneous, and paraspinous tissues were
anesthetized with 1% lidocaine.  The needle was directed into the
L4-5 interspace without difficulty, being careful to avoid the
pedicle screw and rod construct as well as avoiding the interbody
spacer.  Tissue sampling was performed on the right side of the
interspace, which showed greater inflammatory change on MR.

Four predominately bloody specimens were sent for Gram stain,
culture, and sensitivity per request of the ordering physician.  No
frank pus was encountered.
IMPRESSION: Technically successful L4-L5 disc space aspiration.

## 2011-09-10 ENCOUNTER — Telehealth: Payer: Self-pay

## 2011-09-10 NOTE — Telephone Encounter (Signed)
Agreed -
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
## 2012-01-01 ENCOUNTER — Encounter: Payer: Self-pay | Admitting: Internal Medicine

## 2012-01-01 ENCOUNTER — Ambulatory Visit (INDEPENDENT_AMBULATORY_CARE_PROVIDER_SITE_OTHER): Payer: BC Managed Care – PPO | Admitting: Internal Medicine

## 2012-01-01 VITALS — BP 122/88 | HR 63 | Temp 97.9°F | Ht 71.5 in | Wt 194.0 lb

## 2012-01-01 DIAGNOSIS — L03119 Cellulitis of unspecified part of limb: Secondary | ICD-10-CM

## 2012-01-01 DIAGNOSIS — L03115 Cellulitis of right lower limb: Secondary | ICD-10-CM

## 2012-01-01 MED ORDER — CLINDAMYCIN HCL 300 MG PO CAPS: 300.0000 mg | ORAL_CAPSULE | Freq: Three times a day (TID) | ORAL | Status: DC

## 2012-01-01 NOTE — Progress Notes (Signed)
Subjective:    Patient ID: Lucas Robinson, male    DOB: 02-11-1950, 61 y.o.   MRN: [id number]  HPI [place] in woods 1 week ago Hard onto right leg Didn't realize he had cut it but noted it bleeding when he got home  Has cleaned with peroxide and covered with antibiotic cream  Now with stinging and soreness More redness around wound also No discharge--just slight blood on the bandage  Current Outpatient Prescriptions on File Prior to Visit  Medication Sig Dispense Refill  . aspirin 81 MG tablet Take 81 mg by mouth daily.        Marland Kitchen azelastine (ASTELIN) 137 MCG/SPRAY nasal spray PLACE 2 SPRAYS INTO THE NOSE 2 (TWO) TIMES DAILY. USE IN EACH NOSTRIL AS DIRECTED  30 mL  3  . fish oil-omega-3 fatty acids 1000 MG capsule Take 1 g by mouth daily.        . fluticasone (FLONASE) 50 MCG/ACT nasal spray Place 2 sprays into the nose daily.  16 g  6  . ibuprofen (ADVIL,MOTRIN) 200 MG tablet Take 200 mg by mouth as needed.        Marland Kitchen L-Lysine 1000 MG TABS Take 1 tablet by mouth daily as needed.        . mometasone (ELOCON) 0.1 % cream Apply 1 application topically daily as needed.  45 g  1  . Multiple Vitamin (MULTIVITAMIN) tablet Take 1 tablet by mouth daily.        . vitamin C (ASCORBIC ACID) 500 MG tablet Take 500 mg by mouth daily.          Allergies  Allergen Reactions  . Ciprofloxacin     REACTION: PALPITATIONS  . Flomax (Tamsulosin Hcl)     headache    Past Medical History  Diagnosis Date  . HLD (hyperlipidemia) 08/2001  . Back pain     Lumbar ESI (Dr. Gerlene Fee)  . Anal fissure   . Hx: UTI (urinary tract infection)   . Prostatitis   . Enlarged prostate     Past Surgical History  Procedure Date  . Cervical fusion 05/18/2002    C5/6; C6/7 ([facility])  . Vasectomy 1979    Dr. Cristal Generous  . Lumbar fusion     Family History  Problem Relation Age of Onset  . Transient ischemic attack Father   . Stroke Father   . Arthritis Sister   . Coronary artery disease      GF  .  Hypertension    . Colon cancer    . Diabetes    . Ovarian cancer Maternal Grandmother   . Parkinsonism Mother   . Arthritis Sister   . Coronary artery disease    . Colon cancer    . Prostate cancer Neg Hx     History   Social History  . Marital Status: Married    Spouse Name: N/A    Number of Children: 3  . Years of Education: N/A   Occupational History  .      Social History Main Topics  . Smoking status: Never Smoker   . Smokeless tobacco: Never Used  . Alcohol Use: Yes     Comment: red wine daily  . Drug Use: No  . Sexually Active: Not on file   Other Topics Concern  . Not on file   Social History Narrative   Married/ remarried-wife is a Education officer, environmental, working at [employer] in [employer] in [place] prev3 [employer]   Review of Systems  No fever Doesn't feel quite right Had some boils on hip earlier in week---better with l-lysine (bilateral)    Objective:   Physical Exam  Constitutional: He appears well-developed and well-nourished. No distress.  Musculoskeletal:       Small lump which is tender over tibia at top of wound---?hematoma  Skin:       Abrasion over mid tibia on right---surrounding deep redness and mild tenderness          Assessment & Plan:

## 2012-01-01 NOTE — Assessment & Plan Note (Signed)
Had some recent boils on buttock and 1 is clear (scabbed and mostly resolved now) Must consider MRSA--esp with past infection history Will treat with clinda

## 2012-01-01 NOTE — Patient Instructions (Signed)
You may want to try a probiotic or yogurt with live cultures while taking the antibiotic

## 2012-03-08 ENCOUNTER — Other Ambulatory Visit: Payer: Self-pay | Admitting: Family Medicine

## 2012-03-08 DIAGNOSIS — E7800 Pure hypercholesterolemia, unspecified: Secondary | ICD-10-CM

## 2012-03-13 ENCOUNTER — Other Ambulatory Visit (INDEPENDENT_AMBULATORY_CARE_PROVIDER_SITE_OTHER): Payer: BC Managed Care – PPO

## 2012-03-13 DIAGNOSIS — E7800 Pure hypercholesterolemia, unspecified: Secondary | ICD-10-CM

## 2012-03-13 LAB — COMPREHENSIVE METABOLIC PANEL
Albumin: 4.2 g/dL (ref 3.5–5.2)
Alkaline Phosphatase: 51 U/L (ref 39–117)
BUN: 15 mg/dL (ref 6–23)
CO2: 29 mEq/L (ref 19–32)
Calcium: 9.2 mg/dL (ref 8.4–10.5)
Chloride: 101 mEq/L (ref 96–112)
Glucose, Bld: 98 mg/dL (ref 70–99)
Potassium: 4.6 mEq/L (ref 3.5–5.1)
Sodium: 137 mEq/L (ref 135–145)
Total Protein: 7.1 g/dL (ref 6.0–8.3)

## 2012-03-13 LAB — LIPID PANEL: Cholesterol: 187 mg/dL (ref 0–200)

## 2012-03-20 ENCOUNTER — Encounter: Payer: Self-pay | Admitting: Family Medicine

## 2012-03-20 ENCOUNTER — Ambulatory Visit (INDEPENDENT_AMBULATORY_CARE_PROVIDER_SITE_OTHER): Payer: BC Managed Care – PPO | Admitting: Family Medicine

## 2012-03-20 VITALS — BP 130/86 | HR 68 | Temp 98.2°F | Ht 72.0 in | Wt 196.0 lb

## 2012-03-20 DIAGNOSIS — Z0000 Encounter for general adult medical examination without abnormal findings: Secondary | ICD-10-CM

## 2012-03-20 DIAGNOSIS — N400 Enlarged prostate without lower urinary tract symptoms: Secondary | ICD-10-CM

## 2012-03-20 DIAGNOSIS — R972 Elevated prostate specific antigen [PSA]: Secondary | ICD-10-CM

## 2012-03-20 LAB — PSA: PSA: 3.88 ng/mL (ref 0.10–4.00)

## 2012-03-20 NOTE — Patient Instructions (Addendum)
Go to the lab on the way out.  We'll contact you with your lab report.  Check with your insurance to see if they will cover the shingles shot. I would get a flu shot each fall.   I would get either soft arch supports or metarsal cookie (small) for your foot.  Take care.  Glad to see you.

## 2012-03-20 NOTE — Progress Notes (Signed)
CPE- See plan.  Routine anticipatory guidance given to patient.  See health maintenance. Tetanus 2010 Shingles shot d/w pt.   Flu shot done at work.   PSA pending.  Colonoscopy 2012 Living will. D/w pt.  Has a living will.  Wife is designated if incapacitated.   Diet and exercise d/w pt.  He'll work on both.    BPH.  Hasn't seen uro recently.  Due for PSA.  Still with nocturia x1.  No change in stream recently, but slow at baseline.  No burning with urination.   He continues to have some back pain and he hasn't been exercising/stretching as much.  This tends to help.  Discussed.   He has continued to have rhinorrhea, clear.  Year-round.  No help with astelin.  He's tried mult allergy meds with help but if affects his UOP and stream.  flonase didn't help.  He can tolerate the sx with frequent neti pot use.    PMH and SH reviewed  Meds, vitals, and allergies reviewed.   ROS: See HPI.  Otherwise negative.    GEN: nad, alert and oriented HEENT: mucous membranes moist NECK: supple w/o LA CV: rrr. PULM: ctab, no inc wob ABD: soft, +bs EXT: no edema SKIN: no acute rash but small (<<1cm) AK on L forehead. Treated x3 with liq N2 w/o complication.   Prostate gland firm and smooth, symmetric enlargement, but no nodularity, tenderness, mass, asymmetry or induration.

## 2012-03-21 ENCOUNTER — Encounter: Payer: Self-pay | Admitting: *Deleted

## 2012-03-21 DIAGNOSIS — Z0000 Encounter for general adult medical examination without abnormal findings: Secondary | ICD-10-CM | POA: Insufficient documentation

## 2012-03-21 NOTE — Assessment & Plan Note (Signed)
Routine anticipatory guidance given to patient.  See health maintenance. Tetanus 2010 Shingles shot d/w pt.   Flu shot done at work.   PSA pending.  Colonoscopy 2012 Living will. D/w pt.  Has a living will.  Wife is designated if incapacitated.   Diet and exercise d/w pt.  He'll work on both.   He has an incidental dropped 2nd MT head on the R foot with some pain there.  We talked about otc arch supports or small metatarsal pad.  He'll f/u prn for this.

## 2012-03-21 NOTE — Assessment & Plan Note (Signed)
Stable BPH sx and exam with dec in PSA noted.

## 2012-06-27 ENCOUNTER — Ambulatory Visit (INDEPENDENT_AMBULATORY_CARE_PROVIDER_SITE_OTHER): Payer: BC Managed Care – PPO | Admitting: Family Medicine

## 2012-06-27 ENCOUNTER — Encounter: Payer: Self-pay | Admitting: Family Medicine

## 2012-06-27 VITALS — BP 142/84 | HR 100 | Temp 98.0°F | Wt 197.5 lb

## 2012-06-27 DIAGNOSIS — M549 Dorsalgia, unspecified: Secondary | ICD-10-CM

## 2012-06-27 MED ORDER — DIAZEPAM 5 MG PO TABS: 2.5000 mg | ORAL_TABLET | Freq: Two times a day (BID) | ORAL | Status: DC | PRN

## 2012-06-27 NOTE — Assessment & Plan Note (Signed)
He has pain with facet loading on exam.  Less pain with forward flexion.  SLR neg.  Would use valium for spasms, sedation caution, continue stretching and nsaids with GI caution.  He agrees.  F/u prn.

## 2012-06-27 NOTE — Patient Instructions (Addendum)
Take the ibuprofen with food and use the valium for muscle tightness.  This should gradually improved.  Keep stretching.  Take care.

## 2012-06-27 NOTE — Progress Notes (Signed)
He move recently and was lifting some (not furniture), was stooping and bending over.  Thursday was the big day.  Now with some B lower back pain (in a band across the lower back) and L knee pain (but it doesn't radiate down from the back to the knee). Flexeril didn't help.  Ibuprofen 400mg  tid, ice/heat helps some.  He had occ used valium for spams prev with some relief. No weakness, no bowel or bladder incontinence.  He's better if he keeps moving.   He left [employer]. His workload was getting overwhelming and less fulfilling. He's looking for work now.    Meds, vitals, and allergies reviewed.   ROS: See HPI.  Otherwise, noncontributory.  nad rrr ctab Back ttp in a band horizontally across the lumbar area SLR neg Knee with pain anteriorly and med/lat to the patella.  No rash Able to bear weight.

## 2012-07-10 ENCOUNTER — Telehealth: Payer: Self-pay

## 2012-07-10 DIAGNOSIS — M549 Dorsalgia, unspecified: Secondary | ICD-10-CM

## 2012-07-10 NOTE — Telephone Encounter (Signed)
Ordered

## 2012-07-10 NOTE — Telephone Encounter (Signed)
Pt left v/m requesting referral to Dr Ivy Lynn seen 06/27/12, back is worse than when seen, symptoms are similar as when seen just more severe. Pt request cb with appt.

## 2012-07-12 ENCOUNTER — Telehealth: Payer: Self-pay

## 2012-07-12 NOTE — Telephone Encounter (Signed)
Find out how long it is going to be until the OV. Make sure he has no new weakness.  Let me know.  Also, pt will have to come to office to pick up any potential rx for oxycodone.

## 2012-07-12 NOTE — Telephone Encounter (Signed)
Pt left v/m; pt waiting on appt with Dr Gerlene Fee, Hydrocodone,Tramadol and Ibuprofen does not help pain. Pt request rx of oxycontin or oxycodone until can see Dr Gerlene Fee.Please advise.

## 2012-07-13 MED ORDER — OXYCODONE HCL 5 MG PO TABS: 5.0000 mg | ORAL_TABLET | Freq: Four times a day (QID) | ORAL | Status: DC | PRN

## 2012-07-13 NOTE — Telephone Encounter (Signed)
Patient advised.

## 2012-07-13 NOTE — Telephone Encounter (Signed)
Spoke with patient.  No new weakness, just severe pain as it was previously.  He has not heard back from Dr. Trudee Grip office yet as to when he can see him.  Shirlee Limerick is trying to get him an appt and has asked the patient to try to do so as well.  Patient has not seen Dr. Gerlene Fee since 2012 as a follow up from surgery in 2011.  He says it is his left leg rather than his right but he is convinced it is still L4-L5.

## 2012-07-13 NOTE — Telephone Encounter (Signed)
Printed rx.  Use the oxycodone for now, give [place] an update next week on the pain/timing of the OV with [facility].  Thanks.

## 2012-07-20 ENCOUNTER — Encounter: Payer: Self-pay | Admitting: Family Medicine

## 2012-07-20 ENCOUNTER — Ambulatory Visit (INDEPENDENT_AMBULATORY_CARE_PROVIDER_SITE_OTHER): Payer: BC Managed Care – PPO | Admitting: Family Medicine

## 2012-07-20 VITALS — BP 116/80 | HR 88 | Temp 98.1°F | Wt 189.8 lb

## 2012-07-20 DIAGNOSIS — M5430 Sciatica, unspecified side: Secondary | ICD-10-CM

## 2012-07-20 DIAGNOSIS — M5432 Sciatica, left side: Secondary | ICD-10-CM

## 2012-07-20 DIAGNOSIS — M549 Dorsalgia, unspecified: Secondary | ICD-10-CM

## 2012-07-20 MED ORDER — DIAZEPAM 5 MG PO TABS: 2.5000 mg | ORAL_TABLET | Freq: Two times a day (BID) | ORAL | Status: DC | PRN

## 2012-07-20 MED ORDER — OXYCODONE HCL 5 MG PO TABS: 5.0000 mg | ORAL_TABLET | Freq: Four times a day (QID) | ORAL | Status: DC | PRN

## 2012-07-20 NOTE — Patient Instructions (Addendum)
Lucas Robinson will call about your referral. Take miralax as needed for constipation, should you need it.  Sedation caution on the pain meds.  Take ibuprofen with food.  Take care.

## 2012-07-23 ENCOUNTER — Encounter: Payer: Self-pay | Admitting: Family Medicine

## 2012-07-23 NOTE — Progress Notes (Signed)
Back pain.  Pleasant patient.  Prev with L4-5 decompression and cage, complicated by infection requiring 2nd operation and vanc per PICC as an outpatient.  Now with another ongoing flare of pain recently.  He had been using oxycodone (infrequently) along with diazepam for spasm and ibuprofen for pain.  He continues to have pain in the L leg, L4L5 distribution as per previous flares.  No R sided sx.  No weakness.  No bowel or bladder sx.  On bowel regimen with high fiber diet.  No fevers.  He has been getting in the tub, trying to stretch, still with sig pain. He wants second opinion and requests [facility].    Meds, vitals, and allergies reviewed.   ROS: See HPI.  Otherwise, noncontributory.  nad but uncomfortable.  When I entered the room he was leaning over the countertop trying to get comfortable.   Mmm rrr ctab Abd soft Ext w/o edema Back w/o midline pain L buttock TTP SLR deferred.  distaly intact for sensation and motor function on L leg exam

## 2012-07-23 NOTE — Assessment & Plan Note (Signed)
Refilled oxycodone and valium, sedation caution on both.  Continue bowel regimen. Will refer.  Uncomfortable but no weakness or emergent sx currently.  He agrees with plan.

## 2012-07-28 ENCOUNTER — Telehealth: Payer: Self-pay

## 2012-07-28 NOTE — Telephone Encounter (Signed)
Pt left v/m pt is to see doctor at [facility] but needs MRI within 6 months; pt request Dr Para March to order MRI at [facility]; [facility] cannot see pt until 08/21/12. If pt has current MRI, pt can be seen sooner at [facility]. Pt request cb on 07/31/12.

## 2012-07-28 NOTE — Telephone Encounter (Signed)
Pt called back and has already scheduled appt on 08/02/12 and will bring past radiology reports so he can discuss with Dr Para March in person about MRI. Pt does not need cb.

## 2012-07-30 NOTE — Telephone Encounter (Signed)
Noted, thanks. Will see pt at OV.

## 2012-08-01 ENCOUNTER — Encounter: Payer: Self-pay | Admitting: Radiology

## 2012-08-02 ENCOUNTER — Ambulatory Visit (INDEPENDENT_AMBULATORY_CARE_PROVIDER_SITE_OTHER): Payer: BC Managed Care – PPO | Admitting: Family Medicine

## 2012-08-02 ENCOUNTER — Encounter: Payer: Self-pay | Admitting: Family Medicine

## 2012-08-02 VITALS — BP 128/90 | HR 68 | Temp 98.1°F | Wt 197.0 lb

## 2012-08-02 DIAGNOSIS — M549 Dorsalgia, unspecified: Secondary | ICD-10-CM

## 2012-08-02 NOTE — Patient Instructions (Addendum)
See Shirlee Limerick about your referral before you leave today. Take care.  Glad to see you.

## 2012-08-03 ENCOUNTER — Ambulatory Visit
Admission: RE | Admit: 2012-08-03 | Discharge: 2012-08-03 | Disposition: A | Discharge status: Home / Self Care | Payer: BC Managed Care – PPO | Source: Ambulatory Visit | Attending: Family Medicine | Admitting: Family Medicine

## 2012-08-03 DIAGNOSIS — M549 Dorsalgia, unspecified: Secondary | ICD-10-CM

## 2012-08-03 MED ORDER — GADOBENATE DIMEGLUMINE 529 MG/ML IV SOLN
18.0000 mL | Freq: Once | INTRAVENOUS | Status: AC | PRN
  Administered 2012-08-03: 18 mL via INTRAVENOUS

## 2012-08-03 NOTE — Assessment & Plan Note (Signed)
With inc in radicular sx, now with inc in R 1st toes numbness.  Will get MRI with and with contrast to eval and then proceed from there.  He has been in contact with [facility] about eval there.  He agrees with plan.

## 2012-08-03 NOTE — Progress Notes (Signed)
Back pain continue with the L buttock pain, pain radiating down the L leg.  Prev with R 1st toes paresthesia after prev lumbar fusion, but this has increased recently.  No BLE weakness, fevers, saddle numbness, bowel or bladder changes. Has used oxycodone with rare valium for spasms.  On bowel regimen w/o constipation.    Meds, vitals, and allergies reviewed.   ROS: See HPI.  Otherwise, noncontributory.  nad but uncomfortable rrr ctab Back w/o midline pain SLR not performed due to discomfort.  Dec in sensation on R great toe and on R medial thigh No weakness in the BLE on testing Gait affected by pain

## 2012-08-04 ENCOUNTER — Encounter: Payer: Self-pay | Admitting: Family Medicine

## 2012-08-21 ENCOUNTER — Other Ambulatory Visit: Payer: Self-pay

## 2012-08-21 MED ORDER — OXYCODONE HCL 5 MG PO TABS: 5.0000 mg | ORAL_TABLET | Freq: Four times a day (QID) | ORAL | Status: DC | PRN

## 2012-08-21 NOTE — Telephone Encounter (Signed)
Pt left v/m requesting rx oxycodone to be picked up on 08/22/12. Pt said appt today at [facility] was cancelled and cannot see [facility] doctor until 3 more weeks; pt wants to continue pain mgt with Dr Para March. Call pt when rx ready for pick up.

## 2012-08-21 NOTE — Telephone Encounter (Signed)
Printed, thanks

## 2012-08-22 ENCOUNTER — Encounter: Payer: Self-pay | Admitting: Family Medicine

## 2012-08-22 NOTE — Telephone Encounter (Signed)
Patient advised.  Rx left at front desk for pick up. 

## 2012-10-10 ENCOUNTER — Encounter: Payer: Self-pay | Admitting: Family Medicine

## 2012-10-10 ENCOUNTER — Ambulatory Visit (INDEPENDENT_AMBULATORY_CARE_PROVIDER_SITE_OTHER): Payer: BC Managed Care – PPO | Admitting: Family Medicine

## 2012-10-10 VITALS — BP 122/80 | HR 64 | Temp 98.1°F | Ht 72.0 in | Wt 197.0 lb

## 2012-10-10 DIAGNOSIS — R1310 Dysphagia, unspecified: Secondary | ICD-10-CM | POA: Insufficient documentation

## 2012-10-10 NOTE — Assessment & Plan Note (Signed)
No clear sign of bacterial or structural issues. Past neg strep, no lymphadenopathy suggestive of mono.  Most likely due to GERD and esophagitits.  Start with PPI POTC 40 mg daily.  Call if not improving as expected in 1-2 weeks. Go to ER if severe symptoms.  If not improving may need referral for ENDO/swallowing study etc.

## 2012-10-10 NOTE — Patient Instructions (Addendum)
OTC omeprazole (or nexium) 2 tabs for a total of 40 mg daily. Avoid triggers.Marland Kitchen Spicy foods, tomatos, citris, caffeine, alcohol, chocolate, peppermint. Call if symptoms are not improving in 1-2 weeks.  If better continue OTC omeprazole for 4-6 weeks then taper off able. If any significant continued difficult breathing ... Go to the emergency room.

## 2012-10-10 NOTE — Progress Notes (Signed)
  Subjective:    Patient ID: Lucas Robinson, male    DOB: 10/26/1950, 62 y.o.   MRN: [id number]  HPI  62 year old male pt of Dr. Lianne Bushy presents with new onset difficulty swallowing in last 6 months. Occ getting choked with eating/drinking fluids.  He noted white sore area on throat, fatigue.[place] Strep was negative. White area has improved, but there is still a white lesion. Fatigue is better.  Throat feels mildly sore and warm. This AM felt like he could not swallow at all.  Felt like back of throat closed up repeatedly, would open with swallowing, then close again. Ongoing for 1 hour this AM. No fever.  Occ heartburn ongoing daily.  Using antacids TUMs daily  In last 2 weeks.   He took  Cold water and  valium he had this Am which opened up his throat.  No new meds.     Review of Systems  HENT: Positive for ear pain, congestion and postnasal drip. Negative for neck pain.        Has had some allergy symtpoms in last 2 days, uses netty pot   mild twinges in right ear. n o sinus pain or pressure.  Respiratory: Negative for shortness of breath, wheezing and stridor.   Cardiovascular: Negative for chest pain, palpitations and leg swelling.  Gastrointestinal: Negative for abdominal pain.       Objective:   Physical Exam  Constitutional: Vital signs are normal. He appears well-developed and well-nourished.  HENT:  Head: Normocephalic.  Right Ear: Hearing normal.  Left Ear: Hearing normal.  Nose: Nose normal.  Mouth/Throat: Oropharynx is clear and moist and mucous membranes are normal. No oropharyngeal exudate, posterior oropharyngeal edema, posterior oropharyngeal erythema or tonsillar abscesses.  Neck: Trachea normal and normal range of motion. Neck supple. Carotid bruit is not present. No mass and no thyromegaly present.  Cardiovascular: Normal rate, regular rhythm and normal pulses.  Exam reveals no gallop, no distant heart  sounds and no friction rub.   No murmur heard. No peripheral edema  Pulmonary/Chest: Effort normal and breath sounds normal. No respiratory distress.  Lymphadenopathy:       Right cervical: No superficial cervical adenopathy present.      Left cervical: No superficial cervical adenopathy present.  Skin: Skin is warm, dry and intact. No rash noted.  Psychiatric: He has a normal mood and affect. His speech is normal and behavior is normal. Thought content normal.          Assessment & Plan:

## 2012-11-23 ENCOUNTER — Other Ambulatory Visit: Payer: Self-pay

## 2021-11-04 ENCOUNTER — Emergency Department: Payer: Medicare Other

## 2021-11-04 ENCOUNTER — Emergency Department
Admission: EM | Admit: 2021-11-04 | Discharge: 2021-11-04 | Disposition: A | Discharge status: Home / Self Care | Payer: Medicare Other | Attending: Emergency Medicine | Admitting: Emergency Medicine

## 2021-11-04 ENCOUNTER — Other Ambulatory Visit: Payer: Self-pay

## 2021-11-04 DIAGNOSIS — I10 Essential (primary) hypertension: Secondary | ICD-10-CM | POA: Insufficient documentation

## 2021-11-04 DIAGNOSIS — R079 Chest pain, unspecified: Secondary | ICD-10-CM

## 2021-11-04 DIAGNOSIS — E119 Type 2 diabetes mellitus without complications: Secondary | ICD-10-CM | POA: Insufficient documentation

## 2021-11-04 LAB — CBC WITH DIFFERENTIAL/PLATELET
Abs Immature Granulocytes: 0.02 10*3/uL (ref 0.00–0.07)
Basophils Absolute: 0.1 10*3/uL (ref 0.0–0.1)
Basophils Relative: 1 %
Eosinophils Absolute: 0.3 10*3/uL (ref 0.0–0.5)
Eosinophils Relative: 4 %
HCT: 47.1 % (ref 39.0–52.0)
Hemoglobin: 15.7 g/dL (ref 13.0–17.0)
Immature Granulocytes: 0 %
Lymphocytes Relative: 34 %
Lymphs Abs: 2.7 10*3/uL (ref 0.7–4.0)
MCH: 29.3 pg (ref 26.0–34.0)
MCHC: 33.3 g/dL (ref 30.0–36.0)
MCV: 87.9 fL (ref 80.0–100.0)
Monocytes Absolute: 1 10*3/uL (ref 0.1–1.0)
Monocytes Relative: 13 %
Neutro Abs: 3.7 10*3/uL (ref 1.7–7.7)
Neutrophils Relative %: 48 %
Platelets: 234 10*3/uL (ref 150–400)
RBC: 5.36 MIL/uL (ref 4.22–5.81)
RDW: 12.3 % (ref 11.5–15.5)
WBC: 7.7 10*3/uL (ref 4.0–10.5)
nRBC: 0 % (ref 0.0–0.2)

## 2021-11-04 LAB — BASIC METABOLIC PANEL
Anion gap: 7 (ref 5–15)
BUN: 19 mg/dL (ref 8–23)
CO2: 23 mmol/L (ref 22–32)
Calcium: 9.1 mg/dL (ref 8.9–10.3)
Chloride: 106 mmol/L (ref 98–111)
Creatinine, Ser: 0.85 mg/dL (ref 0.61–1.24)
GFR, Estimated: >60 mL/min (ref >60)
Glucose, Bld: 93 mg/dL (ref 70–99)
Potassium: 4 mmol/L (ref 3.5–5.1)
Sodium: 136 mmol/L (ref 135–145)

## 2021-11-04 LAB — TROPONIN I (HIGH SENSITIVITY): Troponin I (High Sensitivity): 8 ng/L (ref <18)

## 2021-11-04 NOTE — Discharge Instructions (Addendum)
You are seen in the emergency department for shoulder pain and elevated blood pressure.  Your blood pressure was elevated in the emergency department.  On review it had not been elevated in the past with primary care visits.  Follow-up with your primary care physician provider to recheck your blood pressure.  Your heart enzyme was normal today and your EKG was normal.  Your chest x-ray did not show any signs of pneumonia.  Return to the emergency department if you have any return of your chest pain.

## 2021-11-04 NOTE — ED Triage Notes (Addendum)
Pt comes with c/o HTN and left arm numbness. Pt stats it is mostly in shoulder area. Pt denies any pain in chest.  Pt states this all started 3 days ago. Pt states hx of numbness in past.  Pt denies any hx of htn or on any meds.  Pt stats he has not been sleeping well.

## 2021-11-04 NOTE — ED Provider Notes (Signed)
[facility] Provider Note    Event Date/Time   First MD Initiated Contact with Patient 11/04/21 1536     (approximate)   History   Hypertension   HPI  Lucas Robinson is a 71 y.o. male with a past medical history significant for chronic neck pain, diabetes presents to the emergency department with high blood pressure.  Patient states that he has not been sleeping well for the past couple of nights.  Does state that his history of OSA but he does not wear his CPAP at night because he accidentally takes it off while he is sleeping.  States that he is tried multiple times to wear his CPAP during nighttime.  States that earlier today he was having some pain to his left shoulder.  States that he is recently had some injury to his left arm and some multiple blood vessel issues so initially he thought it was from his arm injury but then got concerned that it could be something going on with his heart.  Denies any pain at this time.  Denies any chest pain, nausea vomiting or diaphoresis.  Denies any numbness or weakness.  Does not have any pain at this time.  Does not have a history of high blood pressure and states that he checked his blood pressure whenever he was having some pain and it was elevated so he came into the emergency department to get checked out.  Denies prior history of DVT or PE.      Physical Exam   Triage Vital Signs: ED Triage Vitals  Enc Vitals Group     BP 11/04/21 1410 (!) 155/100     Pulse Rate 11/04/21 1410 74     Resp 11/04/21 1410 18     Temp 11/04/21 1410 98 F (36.7 C)     Temp Source 11/04/21 1528 Oral     SpO2 11/04/21 1410 96 %     Weight --      Height --      Head Circumference --      Peak Flow --      Pain Score 11/04/21 1409 3     Pain Loc --      Pain Edu? --      Excl. in [place]? --     Most recent vital signs: Vitals:   11/04/21 1410 11/04/21 1528  BP: (!) 155/100 (!) 141/107  Pulse: 74 68  Resp: 18 20  Temp: 98  F (36.7 C) (!) 97.4 F (36.3 C)  SpO2: 96% 97%    Physical Exam Constitutional:      Appearance: He is well-developed.  HENT:     Head: Atraumatic.  Eyes:     Conjunctiva/sclera: Conjunctivae normal.  Cardiovascular:     Rate and Rhythm: Regular rhythm.  Pulmonary:     Effort: No respiratory distress.  Musculoskeletal:        General: No swelling or tenderness. Normal range of motion.     Cervical back: Normal range of motion.     Comments: No unilateral leg swelling  Skin:    General: Skin is warm.  Neurological:     Mental Status: He is alert. Mental status is at baseline.          IMPRESSION / MDM / ASSESSMENT AND PLAN / ED COURSE  I reviewed the triage vital signs and the nursing notes.  Differential diagnosis including musculoskeletal, OSA, ACS, cervical radiculopathy  Lab work with creatinine at baseline.  No significant electrolyte abnormalities.  No significant anemia.  Initial troponin is negative.  Symptoms have been ongoing for more than 6 hours.  Patient is currently chest pain-free.  Clinical picture is not consistent with pulmonary embolism, low risk Wells criteria do not feel that D-dimer and further work-up for CT angiography is necessary at this time.  Clinical picture is not consistent with a dissection.    EKG  My interpretation of the EKG -normal sinus rhythm.  Normal intervals.  No chamber enlargement.  No significant ST elevation or depression.  No signs of acute ischemia or dysrhythmia.  No tachycardic or bradycardic dysrhythmias while on cardiac telemetry.  RADIOLOGY [My interpretation of imaging: Chest x-ray showed no signs of pneumonia, no pneumothorax or widened mediastinum.]    ED Results / Procedures / Treatments   Labs (all labs ordered are listed, but only abnormal results are displayed) Labs interpreted as -   Lab work without significant elevation of his creatinine.  No significant electrolyte abnormalities.  Troponin added  on to evaluate for ACS.   Labs Reviewed  CBC WITH DIFFERENTIAL/PLATELET  BASIC METABOLIC PANEL  TROPONIN I (HIGH SENSITIVITY)    Troponin is negative.  No significant electrolyte abnormalities.  Chest x-ray without findings consistent with pneumonia.  On reevaluation patient continues to be chest pain and shoulder pain-free.  Clinical picture is not consistent with dissection.  Low suspicion for ACS.  Patient will follow-up closely with primary care provider.  Discussed close follow-up for blood pressure rechecked.  Given return precautions to the emergency department for any ongoing or worsening symptoms.    PROCEDURES:  Critical Care performed: No  Procedures  Patient's presentation is most consistent with acute presentation with potential threat to life or bodily function.   MEDICATIONS ORDERED IN ED: Medications - No data to display  FINAL CLINICAL IMPRESSION(S) / ED DIAGNOSES   Final diagnoses:  Chest pain, unspecified type  Hypertension, unspecified type     Rx / DC Orders   ED Discharge Orders     None        Note:  This document was prepared using Dragon voice recognition software and may include unintentional dictation errors.   Corena Herter, MD 11/04/21 2244

## 2023-08-04 HISTORY — PX: REPLACEMENT TOTAL KNEE: SUR1224

## 2023-08-12 ENCOUNTER — Ambulatory Visit
Admission: RE | Admit: 2023-08-12 | Discharge: 2023-08-12 | Disposition: A | Discharge status: Home / Self Care | Source: Ambulatory Visit | Attending: Orthopedic Surgery | Admitting: Orthopedic Surgery

## 2023-08-12 ENCOUNTER — Other Ambulatory Visit: Payer: Self-pay | Admitting: Orthopedic Surgery

## 2023-08-12 DIAGNOSIS — R2241 Localized swelling, mass and lump, right lower limb: Secondary | ICD-10-CM | POA: Insufficient documentation

## 2023-08-13 ENCOUNTER — Encounter (HOSPITAL_COMMUNITY): Payer: Self-pay | Admitting: Radiology

## 2023-08-13 ENCOUNTER — Other Ambulatory Visit: Payer: Self-pay

## 2023-08-13 ENCOUNTER — Emergency Department (HOSPITAL_COMMUNITY)

## 2023-08-13 ENCOUNTER — Emergency Department (HOSPITAL_COMMUNITY): Admission: EM | Admit: 2023-08-13 | Discharge: 2023-08-13 | Disposition: A | Discharge status: Home / Self Care

## 2023-08-13 DIAGNOSIS — Z7901 Long term (current) use of anticoagulants: Secondary | ICD-10-CM | POA: Insufficient documentation

## 2023-08-13 DIAGNOSIS — G309 Alzheimer's disease, unspecified: Secondary | ICD-10-CM | POA: Diagnosis not present

## 2023-08-13 DIAGNOSIS — R0600 Dyspnea, unspecified: Secondary | ICD-10-CM | POA: Diagnosis not present

## 2023-08-13 DIAGNOSIS — I82461 Acute embolism and thrombosis of right calf muscular vein: Secondary | ICD-10-CM | POA: Insufficient documentation

## 2023-08-13 DIAGNOSIS — R0602 Shortness of breath: Secondary | ICD-10-CM | POA: Diagnosis present

## 2023-08-13 DIAGNOSIS — Z96651 Presence of right artificial knee joint: Secondary | ICD-10-CM | POA: Diagnosis not present

## 2023-08-13 LAB — CBC WITH DIFFERENTIAL/PLATELET
Abs Immature Granulocytes: 0.05 K/uL (ref 0.00–0.07)
Basophils Absolute: 0.1 K/uL (ref 0.0–0.1)
Basophils Relative: 1 %
Eosinophils Absolute: 0.2 K/uL (ref 0.0–0.5)
Eosinophils Relative: 2 %
HCT: 43.8 % (ref 39.0–52.0)
Hemoglobin: 14 g/dL (ref 13.0–17.0)
Immature Granulocytes: 1 %
Lymphocytes Relative: 24 %
Lymphs Abs: 2.1 K/uL (ref 0.7–4.0)
MCH: 29 pg (ref 26.0–34.0)
MCHC: 32 g/dL (ref 30.0–36.0)
MCV: 90.9 fL (ref 80.0–100.0)
Monocytes Absolute: 1.2 K/uL — ABNORMAL HIGH (ref 0.1–1.0)
Monocytes Relative: 14 %
Neutro Abs: 5 K/uL (ref 1.7–7.7)
Neutrophils Relative %: 58 %
Platelets: 344 K/uL (ref 150–400)
RBC: 4.82 MIL/uL (ref 4.22–5.81)
RDW: 11.9 % (ref 11.5–15.5)
WBC: 8.7 K/uL (ref 4.0–10.5)
nRBC: 0 % (ref 0.0–0.2)

## 2023-08-13 LAB — PROTIME-INR
INR: 1 (ref 0.8–1.2)
Prothrombin Time: 14.1 s (ref 11.4–15.2)

## 2023-08-13 LAB — BASIC METABOLIC PANEL WITH GFR
Anion gap: 12 (ref 5–15)
BUN: 17 mg/dL (ref 8–23)
CO2: 23 mmol/L (ref 22–32)
Calcium: 9.1 mg/dL (ref 8.9–10.3)
Chloride: 99 mmol/L (ref 98–111)
Creatinine, Ser: 0.75 mg/dL (ref 0.61–1.24)
GFR, Estimated: >60 mL/min (ref >60)
Glucose, Bld: 97 mg/dL (ref 70–99)
Potassium: 4.1 mmol/L (ref 3.5–5.1)
Sodium: 134 mmol/L — ABNORMAL LOW (ref 135–145)

## 2023-08-13 LAB — APTT: aPTT: 32 s (ref 24–36)

## 2023-08-13 MED ORDER — APIXABAN 5 MG PO TABS
10.0000 mg | ORAL_TABLET | Freq: Once | ORAL | Status: AC
Start: 2023-08-13 — End: 2023-08-13
  Administered 2023-08-13: 10 mg via ORAL
  Filled 2023-08-13: qty 4
  Filled 2023-08-13: qty 2

## 2023-08-13 MED ORDER — IOHEXOL 350 MG/ML SOLN
75.0000 mL | Freq: Once | INTRAVENOUS | Status: AC | PRN
  Administered 2023-08-13: 75 mL via INTRAVENOUS

## 2023-08-13 MED ORDER — APIXABAN 5 MG PO TABS
5.0000 mg | ORAL_TABLET | Freq: Two times a day (BID) | ORAL | 0 refills | Status: AC
Start: 2023-08-13 — End: ?

## 2023-08-13 NOTE — ED Provider Notes (Signed)
 [place] [facility] DEPARTMENT AT [facility] Provider Note   CSN: [id number] Arrival date & time: 08/13/23  1216     Patient presents with: DVT   Lucas Robinson is a 73 y.o. male presents the emergency department with a chief complaint of positive DVT ultrasound.  Patient states that he had right knee replacement surgery approximately 8 days ago and then had his follow-up with orthopedics for a wound check.  At this time his orthopedic surgeon noticed that his right knee and leg seemed more swollen than expected.  He was then scheduled for an outpatient DVT ultrasound which was positive for a occlusive DVT in the right peroneal vein in the mid to distal calf.  The orthopedics surgeon was then contacted who prescribed Xarelto.  Patient and wife state that they have tried to get Xarelto from 3 different pharmacies and cannot find anywhere.  Deny being able to take any blood thinning medication other besides aspirin.  Of note patient also appreciates some mild shortness of breath since surgery.  Denies fever, chills, chest pain, abdominal pain.  Patient has past medical history significant for hyperlipidemia, posterior cortical atrophy, Alzheimer's, visual disturbance, right knee replacement, enlarged prostate.   HPI     Prior to Admission medications   Medication Sig Start Date End Date Taking? Authorizing Provider  apixaban  (ELIQUIS ) 5 MG TABS tablet Take 1 tablet (5 mg total) by mouth 2 (two) times daily. Take two tablets (10mg ) by mouth 2 (two) times daily for the first 7 days, then take one tablet (5mg ) by mouth 2 (two) times daily. 08/13/23  Yes Chelsie Burel F, PA-C  b complex vitamins tablet Take 1 tablet by mouth daily.    [provider]  diazepam  (VALIUM ) 5 MG tablet Take 0.5-1 tablets (2.5-5 mg total) by mouth every 12 (twelve) hours as needed (muscle spasm). 07/20/12   Cleatus Arlyss RAMAN, MD  fish oil-omega-3 fatty acids 1000 MG capsule Take 1 g by mouth daily.       [provider]  ibuprofen (ADVIL,MOTRIN) 200 MG tablet Take 200 mg by mouth as needed.      [provider]  L-Lysine 1000 MG TABS Take 1 tablet by mouth daily as needed.      [provider]  mometasone  (ELOCON ) 0.1 % cream Apply 1 application topically daily as needed. 02/17/11   Cleatus Arlyss RAMAN, MD  Multiple Vitamin (MULTIVITAMIN) tablet Take 1 tablet by mouth daily.      [provider]  oxyCODONE  (ROXICODONE ) 5 MG immediate release tablet Take 1 tablet (5 mg total) by mouth every 6 (six) hours as needed for pain (sedation caution). 08/21/12   Cleatus Arlyss RAMAN, MD  Probiotic Product (PROBIOTIC DAILY PO) Take by mouth as needed.     [provider]  vitamin C (ASCORBIC ACID) 500 MG tablet Take 500 mg by mouth daily.      [provider]    Allergies: Ciprofloxacin, Sulfa  antibiotics, and Flomax  [tamsulosin  hcl]    Review of Systems  Cardiovascular:  Positive for leg swelling.    Updated Vital Signs BP (!) 138/110   Pulse 74   Temp 97.8 F (36.6 C) (Oral)   Resp 15   Ht 5' 11 (1.803 m)   Wt 85.3 kg   SpO2 100%   BMI 26.22 kg/m   Physical Exam Vitals and nursing note reviewed.  Constitutional:      General: He is awake. He is not in acute distress.  Appearance: Normal appearance. He is not ill-appearing, toxic-appearing or diaphoretic.  HENT:     Head: Normocephalic and atraumatic.  Eyes:     General: No scleral icterus. Cardiovascular:     Rate and Rhythm: Normal rate and regular rhythm.  Pulmonary:     Effort: Pulmonary effort is normal. No respiratory distress.     Breath sounds: Normal breath sounds. No wheezing, rhonchi or rales.  Musculoskeletal:        General: Normal range of motion.     Comments: Swelling of right knee and distal right lower extremity, posterior bruising present consistent with recent right knee replacement surgery, mildly tender to palpation  Skin:    General: Skin is warm and dry.      Capillary Refill: Capillary refill takes less than 2 seconds.     Comments: Dressed surgical scar present to anterior right knee, significant bruising present to right posterior calf and distal right lower extremity  Neurological:     General: No focal deficit present.     Mental Status: He is alert and oriented to person, place, and time. Mental status is at baseline.  Psychiatric:        Mood and Affect: Mood normal.        Behavior: Behavior normal. Behavior is cooperative.     (all labs ordered are listed, but only abnormal results are displayed) Labs Reviewed  CBC WITH DIFFERENTIAL/PLATELET - Abnormal; Notable for the following components:      Result Value   Monocytes Absolute 1.2 (*)    All other components within normal limits  BASIC METABOLIC PANEL WITH GFR - Abnormal; Notable for the following components:   Sodium 134 (*)    All other components within normal limits  PROTIME-INR  APTT    EKG: EKG Interpretation Date/Time:  Saturday August 13 2023 12:34:56 EDT Ventricular Rate:  72 PR Interval:  142 QRS Duration:  90 QT Interval:  387 QTC Calculation: 424 R Axis:   -30  Text Interpretation: Sinus rhythm Inferior infarct, old Baseline wander in lead(s) V3 Confirmed by Simon Rea [phone]) on 08/13/2023 2:31:22 PM  Radiology: CT Angio Chest PE W and/or Wo Contrast Result Date: 08/13/2023 CLINICAL DATA:  Shortness of breath. Right calf vein DVT diagnosed on 08/12/2023. EXAM: CT ANGIOGRAPHY CHEST WITH CONTRAST TECHNIQUE: Multidetector CT imaging of the chest was performed using the standard protocol during bolus administration of intravenous contrast. Multiplanar CT image reconstructions and MIPs were obtained to evaluate the vascular anatomy. RADIATION DOSE REDUCTION: This exam was performed according to the departmental dose-optimization program which includes automated exposure control, adjustment of the mA and/or kV according to patient size and/or use of iterative  reconstruction technique. CONTRAST:  75mL OMNIPAQUE  IOHEXOL  350 MG/ML SOLN COMPARISON:  Chest radiograph 08/13/2023 FINDINGS: Cardiovascular: No filling defect is identified in the pulmonary arterial tree to suggest pulmonary embolus. Coronary, aortic arch, and branch vessel atherosclerotic vascular disease. Mild cardiomegaly. Ascending aortic aneurysm 4.4 cm in diameter on image 165 series 5. Mediastinum/Nodes: Unremarkable Lungs/Pleura: Unremarkable Upper Abdomen: Unremarkable Musculoskeletal: Lower cervical plate and screw fixator. Thoracic spondylosis. Mild grade 1 degenerative anterolisthesis at C7-T1. Review of the MIP images confirms the above findings. IMPRESSION: 1. No filling defect is identified in the pulmonary arterial tree to suggest pulmonary embolus. 2. Ascending aortic aneurysm 4.4 cm in diameter. Recommend annual imaging followup by CTA or MRA. This recommendation follows 2010 ACCF/AHA/AATS/ACR/ASA/SCA/SCAI/SIR/STS/SVM Guidelines for the Diagnosis and Management of Patients with Thoracic Aortic Disease. Circulation. 2010; 121: Z733-z630. Aortic  aneurysm NOS (ICD10-I71.9) 3. Mild cardiomegaly. 4. Thoracic spondylosis. Mild grade 1 degenerative anterolisthesis at C7-T1. 5.  Aortic Atherosclerosis (ICD10-I70.0). Electronically Signed   By: Ryan Salvage M.D.   On: 08/13/2023 16:18   DG Chest 2 View Result Date: 08/13/2023 CLINICAL DATA:  Shortness of breath. EXAM: CHEST - 2 VIEW COMPARISON:  Chest radiograph dated 11/04/2021. FINDINGS: No focal consolidation, pleural effusion, or pneumothorax. The cardiac silhouette is within normal limits. No acute osseous pathology. Degenerative changes of the spine. IMPRESSION: No active cardiopulmonary disease. Electronically Signed   By: Vanetta Chou M.D.   On: 08/13/2023 14:33   US  Venous Img Lower Unilateral Right (DVT) Result Date: 08/12/2023 CLINICAL DATA:  Localized swelling EXAM: Right LOWER EXTREMITY VENOUS DOPPLER ULTRASOUND TECHNIQUE:  Gray-scale sonography with compression, as well as color and duplex ultrasound, were performed to evaluate the deep venous system(s) from the level of the common femoral vein through the popliteal and proximal calf veins. COMPARISON:  None Available. FINDINGS: VENOUS Normal compressibility of the common femoral, superficial femoral, and popliteal veins. Occlusive thrombus within the right peroneal vein at the mid to distal calf. Noncompressible. Visualized portions of profunda femoral vein and great saphenous vein unremarkable. Doppler waveforms show normal direction of venous flow, normal respiratory plasticity and response to augmentation within the patent vessels. Limited views of the contralateral common femoral vein are unremarkable. OTHER None. Limitations: none IMPRESSION: Positive for occlusive deep venous thrombosis within the right peroneal vein at the mid to distal calf. These results will be called to the ordering clinician or representative by the Radiologist Assistant, and communication documented in the PACS or [employer]. Electronically Signed   By: Luke Bun M.D.   On: 08/12/2023 16:06     Procedures   Medications Ordered in the ED  apixaban  (ELIQUIS ) tablet 10 mg (10 mg Oral Given 08/13/23 1748)  iohexol  (OMNIPAQUE ) 350 MG/ML injection 75 mL (75 mLs Intravenous Contrast Given 08/13/23 1541)    Clinical Course as of 08/13/23 2026  Sat Aug 13, 2023  1330 After labs come back plan for CT PE study, plan to switch to eliquis   [CH]    Clinical Course User Index [CH] Denaly Gatling, Terrall FALCON, PA-C                                 Medical Decision Making Amount and/or Complexity of Data Reviewed Labs: ordered. Radiology: ordered.  Risk Prescription drug management.   Patient presents to the ED for concern of DVT, shortness of breath, this involves an extensive number of treatment options, and is a complaint that carries with it a high risk of complications and morbidity.  The  differential diagnosis includes DVT, PE, pneumonia, complication of orthopedic surgery, etc.   Co morbidities that complicate the patient evaluation  hyperlipidemia, posterior cortical atrophy, Alzheimer's, visual disturbance, right knee replacement, enlarged prostate   Additional history obtained:  Ultrasound reviewed from 08/12/2023 which was positive for occlusive deep vein thrombosis within the right peroneal vein at the mid to distal calf   Lab Tests:  I Ordered, and personally interpreted labs.  The pertinent results include: CBC and BMP unremarkable, PT INR and PTT as expected for patient on anticoagulation   Imaging Studies ordered:  I ordered imaging studies including chest x-ray, CTA of chest for PE rule out I independently visualized and interpreted imaging which showed no acute processes, known aortic aneurysm seen on CTA, patient and family  member stated that he has had this monitored for some time outpatient, no evidence of PE I agree with the radiologist interpretation   Cardiac Monitoring:  The patient was maintained on a cardiac monitor.  I personally viewed and interpreted the cardiac monitored which showed an underlying rhythm of: Sinus rhythm   Medicines ordered and prescription drug management:  I ordered medication including Eliquis  for known DVT Reevaluation of the patient after these medicines showed that the patient stayed the same I have reviewed the patients home medicines and have made adjustments as needed   Test Considered:  None   Critical Interventions:  None   Problem List / ED Course:  73 year old male, outpatient positive DVT ultrasound for occlusive DVT in right peroneal vein mid to distal calf yesterday, Xarelto called in by orthopedic surgery, patient and wife attempted to pick up Xarelto at 3 different pharmacies out of stock at all of them Patient also complaining of shortness of breath postop from surgery General Lab work  ordered, CBC and BMP unremarkable, chest x-ray ordered as well as CT PE study to rule out possibility of PE Plan to start on Eliquis  and prescribe for outpatient therapy pending further workup since xarelto has been hard to acquire Chest x-ray and CT of chest showed no acute abnormality, no evidence of PE, known aortic aneurysm Patient given 10 mg dose of Eliquis  and Eliquis  starter pack prescribed to outpatient pharmacy Patient instructed to follow-up with his primary care provider as well as orthopedic surgeon and make them aware that he is now on Eliquis  for known DVT Return precautions given Patient educated that he is now on a blood thinning medication and needs to be careful of falls and be evaluated if they occur  Patient discharged At time of discharge unclear cause of shortness of breath, however patient vital signs stable, patient talking in full sentences on room air, no evidence of acute pathology on chest x-ray or CTA, no PE, known DVT based off of yesterday's ultrasound treated with Eliquis  At time of discharge patient vital signs stable   Reevaluation:  After the interventions noted above, I reevaluated the patient and found that they have :stayed the same   Social Determinants of Health:  none   Dispostion:  After consideration of the diagnostic results and the patients response to treatment, I feel that the patent would benefit from discharge and outpatient therapy as prescribed.       Final diagnoses:  Acute deep vein thrombosis (DVT) of calf muscle vein of right lower extremity (HCC)  Dyspnea, unspecified type    ED Discharge Orders          Ordered    apixaban  (ELIQUIS ) 5 MG TABS tablet  2 times daily        08/13/23 1741               Janetta Terrall FALCON, PA-C 08/13/23 2029    Simon Lavonia SAILOR, MD 08/14/23 [phone]

## 2023-08-13 NOTE — Discharge Instructions (Signed)
 It was a pleasure seeing Today.  Based on your history, physical exam, labs, imaging appears here for discharge.  You have been prescribed outpatient Eliquis , please take as prescribed.  As instructed by orthopedic surgeon, please stop aspirin now that you are on a different blood thinner.  Please take this medication as prescribed and follow-up with your primary care provider as well as orthopedic surgeon for ongoing diagnosis and treatment and refills as needed.  If you experience any following symptoms including but not limited to fever, chills, chest pain, shortness of breath, worsening knee pain, worsening knee/leg swelling or concerning color changes, or other concerning symptom please return the emergency department or seek further medical care.  Otherwise recommend follow-up with primary care provider and orthopedic surgeon as scheduled, sooner if symptoms warrant.  Take your orthopedic surgeon aware that you visited the emergency department today and are now on Eliquis  as well as your primary care provider.

## 2023-08-13 NOTE — ED Triage Notes (Signed)
 Pt had a venous ultrasound yesterday which is + for DVT. HE was prescribed Xerelto and [facility] didn't have it and said they wouldn't until Monday. They did find one that had it and it may or may not be in route to the local [facility]. Pt recently had knee replacement.

## 2023-08-26 ENCOUNTER — Other Ambulatory Visit: Payer: Self-pay

## 2023-08-26 ENCOUNTER — Inpatient Hospital Stay
Admission: EM | Admit: 2023-08-26 | Discharge: 2023-08-29 | DRG: 690 | Disposition: A | Discharge status: Skilled Nursing Facility | Attending: Family Medicine | Admitting: Family Medicine

## 2023-08-26 DIAGNOSIS — E785 Hyperlipidemia, unspecified: Secondary | ICD-10-CM | POA: Diagnosis present

## 2023-08-26 DIAGNOSIS — Z833 Family history of diabetes mellitus: Secondary | ICD-10-CM

## 2023-08-26 DIAGNOSIS — Z96651 Presence of right artificial knee joint: Secondary | ICD-10-CM | POA: Diagnosis present

## 2023-08-26 DIAGNOSIS — Z9852 Vasectomy status: Secondary | ICD-10-CM

## 2023-08-26 DIAGNOSIS — F419 Anxiety disorder, unspecified: Secondary | ICD-10-CM | POA: Insufficient documentation

## 2023-08-26 DIAGNOSIS — Z86718 Personal history of other venous thrombosis and embolism: Secondary | ICD-10-CM

## 2023-08-26 DIAGNOSIS — D75839 Thrombocytosis, unspecified: Secondary | ICD-10-CM | POA: Diagnosis present

## 2023-08-26 DIAGNOSIS — F0284 Dementia in other diseases classified elsewhere, unspecified severity, with anxiety: Secondary | ICD-10-CM | POA: Diagnosis present

## 2023-08-26 DIAGNOSIS — I82461 Acute embolism and thrombosis of right calf muscular vein: Secondary | ICD-10-CM

## 2023-08-26 DIAGNOSIS — E878 Other disorders of electrolyte and fluid balance, not elsewhere classified: Secondary | ICD-10-CM | POA: Diagnosis present

## 2023-08-26 DIAGNOSIS — N401 Enlarged prostate with lower urinary tract symptoms: Secondary | ICD-10-CM | POA: Diagnosis present

## 2023-08-26 DIAGNOSIS — R531 Weakness: Secondary | ICD-10-CM | POA: Diagnosis not present

## 2023-08-26 DIAGNOSIS — G309 Alzheimer's disease, unspecified: Secondary | ICD-10-CM | POA: Diagnosis present

## 2023-08-26 DIAGNOSIS — Z881 Allergy status to other antibiotic agents status: Secondary | ICD-10-CM

## 2023-08-26 DIAGNOSIS — Z7901 Long term (current) use of anticoagulants: Secondary | ICD-10-CM

## 2023-08-26 DIAGNOSIS — R339 Retention of urine, unspecified: Secondary | ICD-10-CM | POA: Diagnosis not present

## 2023-08-26 DIAGNOSIS — E871 Hypo-osmolality and hyponatremia: Secondary | ICD-10-CM | POA: Diagnosis not present

## 2023-08-26 DIAGNOSIS — Z8249 Family history of ischemic heart disease and other diseases of the circulatory system: Secondary | ICD-10-CM

## 2023-08-26 DIAGNOSIS — Z882 Allergy status to sulfonamides status: Secondary | ICD-10-CM

## 2023-08-26 DIAGNOSIS — Z87440 Personal history of urinary (tract) infections: Secondary | ICD-10-CM

## 2023-08-26 DIAGNOSIS — N390 Urinary tract infection, site not specified: Secondary | ICD-10-CM | POA: Diagnosis not present

## 2023-08-26 DIAGNOSIS — Z981 Arthrodesis status: Secondary | ICD-10-CM

## 2023-08-26 DIAGNOSIS — N400 Enlarged prostate without lower urinary tract symptoms: Secondary | ICD-10-CM | POA: Insufficient documentation

## 2023-08-26 LAB — URINALYSIS, ROUTINE W REFLEX MICROSCOPIC
Bilirubin Urine: NEGATIVE
Glucose, UA: NEGATIVE mg/dL
Ketones, ur: NEGATIVE mg/dL
Leukocytes,Ua: NEGATIVE
Nitrite: POSITIVE — AB
Protein, ur: NEGATIVE mg/dL
Specific Gravity, Urine: 1.009 (ref 1.005–1.030)
Squamous Epithelial / HPF: 0 /HPF (ref 0–5)
pH: 6 (ref 5.0–8.0)

## 2023-08-26 LAB — BASIC METABOLIC PANEL WITH GFR
Anion gap: 15 (ref 5–15)
BUN: 13 mg/dL (ref 8–23)
CO2: 20 mmol/L — ABNORMAL LOW (ref 22–32)
Calcium: 9.9 mg/dL (ref 8.9–10.3)
Chloride: 87 mmol/L — ABNORMAL LOW (ref 98–111)
Creatinine, Ser: 0.99 mg/dL (ref 0.61–1.24)
GFR, Estimated: >60 mL/min (ref >60)
Glucose, Bld: 135 mg/dL — ABNORMAL HIGH (ref 70–99)
Potassium: 4.3 mmol/L (ref 3.5–5.1)
Sodium: 122 mmol/L — ABNORMAL LOW (ref 135–145)

## 2023-08-26 LAB — CBC
HCT: 40.7 % (ref 39.0–52.0)
Hemoglobin: 14 g/dL (ref 13.0–17.0)
MCH: 29.6 pg (ref 26.0–34.0)
MCHC: 34.4 g/dL (ref 30.0–36.0)
MCV: 86 fL (ref 80.0–100.0)
Platelets: 450 K/uL — ABNORMAL HIGH (ref 150–400)
RBC: 4.73 MIL/uL (ref 4.22–5.81)
RDW: 11.6 % (ref 11.5–15.5)
WBC: 12.3 K/uL — ABNORMAL HIGH (ref 4.0–10.5)
nRBC: 0 % (ref 0.0–0.2)

## 2023-08-26 MED ORDER — MAGNESIUM HYDROXIDE 400 MG/5ML PO SUSP: 30.0000 mL | Freq: Every day | ORAL | Status: DC | PRN

## 2023-08-26 MED ORDER — APIXABAN 5 MG PO TABS
5.0000 mg | ORAL_TABLET | Freq: Two times a day (BID) | ORAL | Status: DC
  Administered 2023-08-27 – 2023-08-29 (×7): 5 mg via ORAL
  Filled 2023-08-26 (×6): qty 1

## 2023-08-26 MED ORDER — SODIUM CHLORIDE 0.9 % IV SOLN
1.0000 g | Freq: Once | INTRAVENOUS | Status: AC
  Administered 2023-08-26: 1 g via INTRAVENOUS
  Filled 2023-08-26: qty 10

## 2023-08-26 MED ORDER — B COMPLEX-C PO TABS
1.0000 | ORAL_TABLET | Freq: Every day | ORAL | Status: DC
  Administered 2023-08-27 – 2023-08-29 (×4): 1 via ORAL
  Filled 2023-08-26 (×3): qty 1

## 2023-08-26 MED ORDER — OMEGA-3-ACID ETHYL ESTERS 1 G PO CAPS
1.0000 g | ORAL_CAPSULE | Freq: Every day | ORAL | Status: DC
  Administered 2023-08-27 – 2023-08-29 (×4): 1 g via ORAL
  Filled 2023-08-26 (×3): qty 1

## 2023-08-26 MED ORDER — ACETAMINOPHEN 325 MG RE SUPP: 650.0000 mg | Freq: Four times a day (QID) | RECTAL | Status: DC | PRN

## 2023-08-26 MED ORDER — DIAZEPAM 5 MG PO TABS: 2.5000 mg | ORAL_TABLET | Freq: Two times a day (BID) | ORAL | Status: DC | PRN

## 2023-08-26 MED ORDER — ACETAMINOPHEN 325 MG PO TABS
650.0000 mg | ORAL_TABLET | Freq: Four times a day (QID) | ORAL | Status: DC | PRN
Start: 2023-08-26 — End: 2023-08-29
  Administered 2023-08-26: 650 mg via ORAL
  Filled 2023-08-26 (×2): qty 2

## 2023-08-26 MED ORDER — SODIUM CHLORIDE 0.9 % IV BOLUS
1000.0000 mL | Freq: Once | INTRAVENOUS | Status: AC
  Administered 2023-08-26: 1000 mL via INTRAVENOUS

## 2023-08-26 MED ORDER — ENSURE PLUS HIGH PROTEIN PO LIQD
237.0000 mL | Freq: Two times a day (BID) | ORAL | Status: DC
  Administered 2023-08-27 (×2): 237 mL via ORAL

## 2023-08-26 MED ORDER — OXYCODONE HCL 5 MG PO TABS: 5.0000 mg | ORAL_TABLET | Freq: Four times a day (QID) | ORAL | Status: DC | PRN

## 2023-08-26 MED ORDER — SODIUM CHLORIDE 0.9 % IV SOLN
1.0000 g | INTRAVENOUS | Status: DC
  Administered 2023-08-27 – 2023-08-28 (×2): 1 g via INTRAVENOUS
  Filled 2023-08-26 (×2): qty 10

## 2023-08-26 MED ORDER — ADULT MULTIVITAMIN W/MINERALS CH
1.0000 | ORAL_TABLET | Freq: Every day | ORAL | Status: DC
  Administered 2023-08-27 – 2023-08-29 (×4): 1 via ORAL
  Filled 2023-08-26 (×3): qty 1

## 2023-08-26 MED ORDER — TRAZODONE HCL 50 MG PO TABS
25.0000 mg | ORAL_TABLET | Freq: Every evening | ORAL | Status: DC | PRN
Start: 2023-08-26 — End: 2023-08-29
  Administered 2023-08-28: 25 mg via ORAL
  Filled 2023-08-26 (×2): qty 1

## 2023-08-26 MED ORDER — ONDANSETRON HCL 4 MG PO TABS: 4.0000 mg | ORAL_TABLET | Freq: Four times a day (QID) | ORAL | Status: DC | PRN

## 2023-08-26 MED ORDER — SODIUM CHLORIDE 0.9 % IV SOLN: INTRAVENOUS | Status: DC

## 2023-08-26 MED ORDER — ONDANSETRON HCL 4 MG/2ML IJ SOLN: 4.0000 mg | Freq: Four times a day (QID) | INTRAMUSCULAR | Status: DC | PRN

## 2023-08-26 MED ORDER — VITAMIN C 500 MG PO TABS: 500.0000 mg | ORAL_TABLET | Freq: Every day | ORAL | Status: DC

## 2023-08-26 NOTE — Assessment & Plan Note (Signed)
-   This is secondary to #1 and #2. - Management as above. - PT consult will be obtained.

## 2023-08-26 NOTE — Assessment & Plan Note (Addendum)
-   Will follow urine culture and sensitivity. - Will continue IV Rocephin .He

## 2023-08-26 NOTE — ED Triage Notes (Signed)
 Pt to ed from [facility] via their staff. Pt had foley catheter placed on 7/20 and removed 8/7 and is now unable to urinate. Pt is in pain. Pt is having some dribble but not a full stream. Pull up is yellow per wife and pt.

## 2023-08-26 NOTE — ED Notes (Signed)
Admitting MD at bedside at bedside

## 2023-08-26 NOTE — Assessment & Plan Note (Signed)
-   Continues Valium .

## 2023-08-26 NOTE — ED Provider Notes (Signed)
 [facility] Provider Note   Event Date/Time   First MD Initiated Contact with Patient 08/26/23 1921     (approximate) History  Urinary Retention  HPI Lucas Robinson is a 73 y.o. male with a past medical history of dementia and recurrent urinary retention who presents complaining of not being able to pee over the last 24 hours.  Patient was found to have increased volume in his bladder and Foley catheter was placed prior to this evaluation.  Patient states that pain is much improved.  Patient endorses worsening generalized weakness over the last few days as well. ROS: Patient currently denies any vision changes, tinnitus, difficulty speaking, facial droop, sore throat, chest pain, shortness of breath, abdominal pain, nausea/vomiting/diarrhea, dysuria, or numbness/paresthesias in any extremity   Physical Exam  Triage Vital Signs: ED Triage Vitals  Encounter Vitals Group     BP 08/26/23 1811 (!) 131/97     Girls Systolic BP Percentile --      Girls Diastolic BP Percentile --      Boys Systolic BP Percentile --      Boys Diastolic BP Percentile --      Pulse Rate 08/26/23 1811 100     Resp 08/26/23 1811 16     Temp 08/26/23 1811 98 F (36.7 C)     Temp Source 08/26/23 1811 Oral     SpO2 08/26/23 1811 100 %     Weight --      Height 08/26/23 1812 5' 11 (1.803 m)     Head Circumference --      Peak Flow --      Pain Score 08/26/23 1812 10     Pain Loc --      Pain Education --      Exclude from Growth Chart --    Most recent vital signs: Vitals:   08/26/23 2058 08/26/23 2239  BP: (!) 151/93 (!) 152/90  Pulse: 76 90  Resp: 16 20  Temp: 99.2 F (37.3 C) 98.4 F (36.9 C)  SpO2: 100% 100%   General: Awake, oriented x4. CV:  Good peripheral perfusion. Resp:  Normal effort. Abd:  No distention.  Foley catheter in place Other:  Elderly well-developed, well-nourished Caucasian male resting comfortably in no acute distress ED Results / Procedures /  Treatments  Labs (all labs ordered are listed, but only abnormal results are displayed) Labs Reviewed  URINALYSIS, ROUTINE W REFLEX MICROSCOPIC - Abnormal; Notable for the following components:      Result Value   Color, Urine AMBER (*)    APPearance CLEAR (*)    Hgb urine dipstick SMALL (*)    Nitrite POSITIVE (*)    Bacteria, UA RARE (*)    All other components within normal limits  BASIC METABOLIC PANEL WITH GFR - Abnormal; Notable for the following components:   Sodium 122 (*)    Chloride 87 (*)    CO2 20 (*)    Glucose, Bld 135 (*)    All other components within normal limits  CBC - Abnormal; Notable for the following components:   WBC 12.3 (*)    Platelets 450 (*)    All other components within normal limits  BASIC METABOLIC PANEL WITH GFR  CBC  PROCEDURES: Critical Care performed: No Procedures MEDICATIONS ORDERED IN ED: Medications  oxyCODONE  (Oxy IR/ROXICODONE ) immediate release tablet 5 mg (has no administration in time range)  diazepam  (VALIUM ) tablet 2.5-5 mg (has no administration in time range)  apixaban  (ELIQUIS )  tablet 5 mg (has no administration in time range)  b complex vitamins tablet 1 tablet (has no administration in time range)  fish oil-omega-3 fatty acids  capsule 1 g (has no administration in time range)  multivitamin tablet 1 tablet (has no administration in time range)  ascorbic acid (VITAMIN C ) tablet 500 mg (has no administration in time range)  0.9 %  sodium chloride  infusion ( Intravenous New Bag/Given 08/26/23 2314)  acetaminophen  (TYLENOL ) tablet 650 mg (650 mg Oral Given 08/26/23 2312)    Or  acetaminophen  (TYLENOL ) suppository 650 mg ( Rectal See Alternative 08/26/23 2312)  traZODone  (DESYREL ) tablet 25 mg (has no administration in time range)  magnesium  hydroxide (MILK OF MAGNESIA) suspension 30 mL (has no administration in time range)  ondansetron  (ZOFRAN ) tablet 4 mg (has no administration in time range)    Or  ondansetron  (ZOFRAN ) injection  4 mg (has no administration in time range)  feeding supplement (ENSURE PLUS HIGH PROTEIN) liquid 237 mL (has no administration in time range)  sodium chloride  0.9 % bolus 1,000 mL (0 mLs Intravenous Stopped 08/26/23 2222)  cefTRIAXone  (ROCEPHIN ) 1 g in sodium chloride  0.9 % 100 mL IVPB (0 g Intravenous Stopped 08/26/23 2222)   IMPRESSION / MDM / ASSESSMENT AND PLAN / ED COURSE  I reviewed the triage vital signs and the nursing notes.                             The patient is on the cardiac monitor to evaluate for evidence of arrhythmia and/or significant heart rate changes. Patient's presentation is most consistent with acute presentation with potential threat to life or bodily function. Patient found to be hyponatremic to 122. Patient mentating normally. Patient not hypovolemic so doubt extra renal losses such as GI losses, burns, 3rd spacing, or diuretic use. Labs are not consistent with adrenal insufficiency. 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 Patient not hypervolemic on exam with no history of CHF, cirrhosis, nephrotic syndrome, no acute renal failure.  Patient shows signs of urinary tract infection Tx: 1 L NS, Rocephin  Dispo: Discharge home with PCP follow-up   FINAL CLINICAL IMPRESSION(S) / ED DIAGNOSES   Final diagnoses:  Urinary retention  Lower urinary tract infectious disease  Hyponatremia   Rx / DC Orders   ED Discharge Orders     None      Note:  This document was prepared using Dragon voice recognition software and may include unintentional dictation errors.   Jossie Artist POUR, MD 08/26/23 [phone]

## 2023-08-26 NOTE — ED Notes (Signed)
 Applied pt on bedpan at this time, pt removed from bedpan as the RN put in a catheter at this time. Pt provided with warm blankets and put in a hospital gown.

## 2023-08-26 NOTE — ED Notes (Signed)
Dr. Bradler at bedside 

## 2023-08-26 NOTE — Assessment & Plan Note (Addendum)
-   He will be hydrated with IV normal saline and will limit p.o. fluid intake. - Will obtain hyponatremia workup. - Will follow sodium levels. - This is likely contributing to his generalized weakness.

## 2023-08-26 NOTE — Assessment & Plan Note (Addendum)
-   Will continue Flomax  and Proscar .

## 2023-08-26 NOTE — Assessment & Plan Note (Signed)
-   This was diagnosed on 08/12/2023. - Will continue Eliquis .

## 2023-08-26 NOTE — H&P (Addendum)
 We did we put a hold     [place]   PATIENT NAME: Lucas Robinson    MR#:  [id number]  DATE OF BIRTH:  08-Aug-1950  DATE OF ADMISSION:  08/26/2023  PRIMARY CARE PHYSICIAN: Lucas Lenis, MD   Patient is coming from: Home  REQUESTING/REFERRING PHYSICIAN: Jossie Birmingham, MD  CHIEF COMPLAINT:   Chief Complaint  Patient presents with   Urinary Retention    HISTORY OF PRESENT ILLNESS:  Lucas Robinson is a 73 y.o. Caucasian male with medical history significant for BPH, dyslipidemia and chronic back pain, who presented to the emergency room with acute onset of urinary retention.  The patient had a Foley catheter removed in his [facility] urologist office today before he had retention.  He has been feeling weak over the last few days.  He denied any hematuria or flank pain.  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  A few attempts at removing the catheter unfortunately did not succeed and he developed urinary retention.  He denies any fever or chills.  No nausea or vomiting or abdominal pain.  No cough or wheezing or hemoptysis.  No chest pain or palpitations.  ED Course: When he came to the ER, BP was 131/97 and later 151/93 with otherwise normal vital signs.  Labs revealed hyponatremia 122 and hypochloremia 87 with a CO2 of 20 and blood glucose of 135.  CBC showed leukocytosis 12.3 and thrombocytosis of 450.  Urinalysis showed positive nitrite rare bacteria with 0-5 WBCs.  EKG: None. Imaging: None.  The patient was given 1 L bolus of IV normal saline and 1 g of IV Rocephin  and had a Foley catheter placed with significant subsequent diuresis of about a liter.  He will be admitted to a medical telemetry observation bed for further evaluation and management.   PAST MEDICAL HISTORY:   Past Medical History:  Diagnosis Date   Anal fissure    Back pain    Lumbar ESI (Dr. Carles)   Enlarged prostate    HLD (hyperlipidemia) 08/2001    Hx: UTI (urinary tract infection)    Prostatitis     PAST SURGICAL HISTORY:   Past Surgical History:  Procedure Laterality Date   CERVICAL FUSION  05/18/2002   C5/6; C6/7 ([facility]ritzer)   LUMBAR FUSION     REPLACEMENT TOTAL KNEE Right 08/04/2023   VASECTOMY  01/18/1977   Dr. Nestor    SOCIAL HISTORY:   Social History   Tobacco Use   Smoking status: Never   Smokeless tobacco: Never  Substance Use Topics   Alcohol use: Yes    Comment: beer occasionally    FAMILY HISTORY:   Family History  Problem Relation Age of Onset   Transient ischemic attack Father    Stroke Father    Arthritis Sister    Coronary artery disease Other        GF   Hypertension Other    Colon cancer Other    Diabetes Other    Ovarian cancer Maternal Grandmother    Parkinsonism Mother    Arthritis Sister    Coronary artery disease Other    Colon cancer Other    Prostate cancer Neg Hx     DRUG ALLERGIES:   Allergies  Allergen Reactions   Ciprofloxacin Other (See Comments) and Swelling    REACTION: PALPITATIONS  Other Reaction(s): Not available  ciprofloxacin   Sulfa  Antibiotics Other (See Comments)  REVIEW OF SYSTEMS:   ROS As per history of present illness. All pertinent systems were reviewed above. Constitutional, HEENT, cardiovascular, respiratory, GI, GU, musculoskeletal, neuro, psychiatric, endocrine, integumentary and hematologic systems were reviewed and are otherwise negative/unremarkable except for positive findings mentioned above in the HPI.   MEDICATIONS AT HOME:   Prior to Admission medications   Medication Sig Start Date End Date Taking? Authorizing Provider  apixaban  (ELIQUIS ) 5 MG TABS tablet Take 1 tablet (5 mg total) by mouth 2 (two) times daily. Take two tablets (10mg ) by mouth 2 (two) times daily for the first 7 days, then take one tablet (5mg ) by mouth 2 (two) times daily. 08/13/23   Hinnant, Collin F, PA-C  b complex vitamins tablet Take 1 tablet by mouth  daily.    [provider]  diazepam  (VALIUM ) 5 MG tablet Take 0.5-1 tablets (2.5-5 mg total) by mouth every 12 (twelve) hours as needed (muscle spasm). 07/20/12   Cleatus Arlyss RAMAN, MD  fish oil-omega-3 fatty acids  1000 MG capsule Take 1 g by mouth daily.      [provider]  ibuprofen (ADVIL,MOTRIN) 200 MG tablet Take 200 mg by mouth as needed.      [provider]  L-Lysine 1000 MG TABS Take 1 tablet by mouth daily as needed.      [provider]  mometasone  (ELOCON ) 0.1 % cream Apply 1 application topically daily as needed. 02/17/11   Cleatus Arlyss RAMAN, MD  Multiple Vitamin (MULTIVITAMIN) tablet Take 1 tablet by mouth daily.      [provider]  oxyCODONE  (ROXICODONE ) 5 MG immediate release tablet Take 1 tablet (5 mg total) by mouth every 6 (six) hours as needed for pain (sedation caution). 08/21/12   Cleatus Arlyss RAMAN, MD  Probiotic Product (PROBIOTIC DAILY PO) Take by mouth as needed.     [provider]  vitamin C  (ASCORBIC ACID) 500 MG tablet Take 500 mg by mouth daily.      [provider]      VITAL SIGNS:  Blood pressure (!) 152/90, pulse 90, temperature 98.4 F (36.9 C), temperature source Oral, resp. rate 20, height 5' 11 (1.803 m), weight 85.3 kg, SpO2 100%.  PHYSICAL EXAMINATION:  Physical Exam  GENERAL:  73 y.o.-year-old, Caucasian male patient lying in the bed with no acute distress.  EYES: Pupils equal, round, reactive to light and accommodation. No scleral icterus. Extraocular muscles intact.  HEENT: Head atraumatic, normocephalic. Oropharynx and nasopharynx clear.  NECK:  Supple, no jugular venous distention. No thyroid enlargement, no tenderness.  LUNGS: Normal breath sounds bilaterally, no wheezing, rales,rhonchi or crepitation. No use of accessory muscles of respiration.  CARDIOVASCULAR: Regular rate and rhythm, S1, S2 normal. No murmurs, rubs, or gallops.  ABDOMEN: Soft, nondistended, nontender. Bowel sounds  present. No organomegaly or mass.  EXTREMITIES: Right more than left lower extremity edema, with no cyanosis, or clubbing.  NEUROLOGIC: Cranial nerves II through XII are intact. Muscle strength 5/5 in all extremities. Sensation intact. Gait not checked.  PSYCHIATRIC: The patient is alert and oriented x 3.  Normal affect and good eye contact. SKIN: No obvious rash, lesion, or ulcer.   LABORATORY PANEL:   CBC Recent Labs  Lab 08/26/23 1814  WBC 12.3*  HGB 14.0  HCT 40.7  PLT 450*   ------------------------------------------------------------------------------------------------------------------  Chemistries  Recent Labs  Lab 08/26/23 1814  NA 122*  K 4.3  CL 87*  CO2 20*  GLUCOSE 135*  BUN 13  CREATININE 0.99  CALCIUM 9.9   ------------------------------------------------------------------------------------------------------------------  Cardiac Enzymes No results for input(s): TROPONINI in the last 168 hours. ------------------------------------------------------------------------------------------------------------------  RADIOLOGY:  No results found.    IMPRESSION AND PLAN:  Assessment and Plan: * Urinary retention - The patient will be admitted to observation medical telemetry bed. - Will monitor ins and outs with current Foley catheter. - Urology consult obtained as needed in AM. - This is contributing to his generalized weakness.  Hyponatremia - He will be hydrated with IV normal saline and will limit p.o. fluid intake. - Will obtain hyponatremia workup. - Will follow sodium levels. - This is likely contributing to his generalized weakness.  Acute lower UTI - Will follow urine culture and sensitivity. - Will continue IV Rocephin .He  Generalized weakness - This is secondary to #1 and #2. - Management as above. - PT consult will be obtained.  Acute deep vein thrombosis (DVT) of calf muscle vein of right lower extremity (HCC) - This was diagnosed  on 08/12/2023. - Will continue Eliquis .  Anxiety - Continues Valium .  BPH (benign prostatic hyperplasia) - Will continue Flomax  and Proscar.       DVT prophylaxis: Eliquis . Advanced Care Planning:  Code Status: full code.  Family Communication:  The plan of care was discussed in details with the patient (and family). I answered all questions. The patient agreed to proceed with the above mentioned plan. Further management will depend upon hospital course. Disposition Plan: Back to previous home environment Consults called: none.  All the records are reviewed and case discussed with ED provider.  Status is: Observation  I certify that at the time of admission, it is my clinical judgment that the patient will require t hospital care extending less than 2 midnights.                            Dispo: The patient is from: Home              Anticipated d/c is to: Home              Patient currently is not medically stable to d/c.              Difficult to place patient: No  Madison DELENA Peaches M.D on 08/26/2023 at 11:43 PM  [facility]   From 7 PM-7 AM, contact night-coverage [email or web address]  CC: Primary care physician; Lucas Lenis, MD

## 2023-08-26 NOTE — Assessment & Plan Note (Signed)
-   The patient will be admitted to observation medical telemetry bed. - Will monitor ins and outs with current Foley catheter. - Urology consult obtained as needed in AM. - This is contributing to his generalized weakness.

## 2023-08-26 NOTE — ED Notes (Signed)
 Bladder scan showed >795 ml

## 2023-08-27 DIAGNOSIS — Z981 Arthrodesis status: Secondary | ICD-10-CM | POA: Diagnosis not present

## 2023-08-27 DIAGNOSIS — D75839 Thrombocytosis, unspecified: Secondary | ICD-10-CM | POA: Diagnosis present

## 2023-08-27 DIAGNOSIS — Z86718 Personal history of other venous thrombosis and embolism: Secondary | ICD-10-CM | POA: Diagnosis not present

## 2023-08-27 DIAGNOSIS — I82461 Acute embolism and thrombosis of right calf muscular vein: Secondary | ICD-10-CM | POA: Diagnosis present

## 2023-08-27 DIAGNOSIS — Z96651 Presence of right artificial knee joint: Secondary | ICD-10-CM | POA: Diagnosis present

## 2023-08-27 DIAGNOSIS — E878 Other disorders of electrolyte and fluid balance, not elsewhere classified: Secondary | ICD-10-CM | POA: Diagnosis present

## 2023-08-27 DIAGNOSIS — E871 Hypo-osmolality and hyponatremia: Secondary | ICD-10-CM | POA: Diagnosis present

## 2023-08-27 DIAGNOSIS — Z7901 Long term (current) use of anticoagulants: Secondary | ICD-10-CM | POA: Diagnosis not present

## 2023-08-27 DIAGNOSIS — G309 Alzheimer's disease, unspecified: Secondary | ICD-10-CM | POA: Diagnosis present

## 2023-08-27 DIAGNOSIS — Z881 Allergy status to other antibiotic agents status: Secondary | ICD-10-CM | POA: Diagnosis not present

## 2023-08-27 DIAGNOSIS — N401 Enlarged prostate with lower urinary tract symptoms: Secondary | ICD-10-CM

## 2023-08-27 DIAGNOSIS — Z87440 Personal history of urinary (tract) infections: Secondary | ICD-10-CM | POA: Diagnosis not present

## 2023-08-27 DIAGNOSIS — Z8249 Family history of ischemic heart disease and other diseases of the circulatory system: Secondary | ICD-10-CM | POA: Diagnosis not present

## 2023-08-27 DIAGNOSIS — Z9852 Vasectomy status: Secondary | ICD-10-CM | POA: Diagnosis not present

## 2023-08-27 DIAGNOSIS — N390 Urinary tract infection, site not specified: Secondary | ICD-10-CM | POA: Diagnosis present

## 2023-08-27 DIAGNOSIS — Z882 Allergy status to sulfonamides status: Secondary | ICD-10-CM | POA: Diagnosis not present

## 2023-08-27 DIAGNOSIS — R339 Retention of urine, unspecified: Secondary | ICD-10-CM | POA: Diagnosis present

## 2023-08-27 DIAGNOSIS — Z833 Family history of diabetes mellitus: Secondary | ICD-10-CM | POA: Diagnosis not present

## 2023-08-27 DIAGNOSIS — F0284 Dementia in other diseases classified elsewhere, unspecified severity, with anxiety: Secondary | ICD-10-CM | POA: Diagnosis present

## 2023-08-27 DIAGNOSIS — R531 Weakness: Secondary | ICD-10-CM | POA: Diagnosis not present

## 2023-08-27 DIAGNOSIS — E785 Hyperlipidemia, unspecified: Secondary | ICD-10-CM | POA: Diagnosis present

## 2023-08-27 LAB — OSMOLALITY: Osmolality: 257 mosm/kg — ABNORMAL LOW (ref 275–295)

## 2023-08-27 LAB — CBC
HCT: 36.7 % — ABNORMAL LOW (ref 39.0–52.0)
Hemoglobin: 12.2 g/dL — ABNORMAL LOW (ref 13.0–17.0)
MCH: 28.8 pg (ref 26.0–34.0)
MCHC: 33.2 g/dL (ref 30.0–36.0)
MCV: 86.8 fL (ref 80.0–100.0)
Platelets: 345 K/uL (ref 150–400)
RBC: 4.23 MIL/uL (ref 4.22–5.81)
RDW: 11.9 % (ref 11.5–15.5)
WBC: 7.4 K/uL (ref 4.0–10.5)
nRBC: 0 % (ref 0.0–0.2)

## 2023-08-27 LAB — NA AND K (SODIUM & POTASSIUM), RAND UR
Potassium Urine: 30 mmol/L
Sodium, Ur: <10 mmol/L

## 2023-08-27 LAB — BASIC METABOLIC PANEL WITH GFR
Anion gap: 10 (ref 5–15)
BUN: 11 mg/dL (ref 8–23)
CO2: 25 mmol/L (ref 22–32)
Calcium: 9 mg/dL (ref 8.9–10.3)
Chloride: 104 mmol/L (ref 98–111)
Creatinine, Ser: 0.73 mg/dL (ref 0.61–1.24)
GFR, Estimated: >60 mL/min (ref >60)
Glucose, Bld: 97 mg/dL (ref 70–99)
Potassium: 3.9 mmol/L (ref 3.5–5.1)
Sodium: 139 mmol/L (ref 135–145)

## 2023-08-27 LAB — OSMOLALITY, URINE: Osmolality, Ur: 285 mosm/kg — ABNORMAL LOW (ref 300–900)

## 2023-08-27 LAB — TSH: TSH: 5.265 u[IU]/mL — ABNORMAL HIGH (ref 0.350–4.500)

## 2023-08-27 MED ORDER — TRAMADOL HCL 50 MG PO TABS
50.0000 mg | ORAL_TABLET | Freq: Two times a day (BID) | ORAL | Status: DC | PRN
  Administered 2023-08-27 – 2023-08-29 (×6): 50 mg via ORAL
  Filled 2023-08-27 (×5): qty 1

## 2023-08-27 MED ORDER — FINASTERIDE 5 MG PO TABS
5.0000 mg | ORAL_TABLET | Freq: Every day | ORAL | Status: DC
  Administered 2023-08-27 – 2023-08-29 (×4): 5 mg via ORAL
  Filled 2023-08-27 (×3): qty 1

## 2023-08-27 MED ORDER — MEMANTINE HCL 5 MG PO TABS
10.0000 mg | ORAL_TABLET | Freq: Two times a day (BID) | ORAL | Status: DC
  Administered 2023-08-27 – 2023-08-29 (×5): 10 mg via ORAL
  Filled 2023-08-27 (×4): qty 2

## 2023-08-27 MED ORDER — TAMSULOSIN HCL 0.4 MG PO CAPS
0.4000 mg | ORAL_CAPSULE | Freq: Every day | ORAL | Status: DC
  Administered 2023-08-27 – 2023-08-28 (×2): 0.4 mg via ORAL
  Filled 2023-08-27 (×2): qty 1

## 2023-08-27 NOTE — NC FL2 (Signed)
 [place]  MEDICAID FL2 LEVEL OF CARE FORM     IDENTIFICATION  Patient Name: Lucas Robinson Birthdate: 1950/10/24 Sex: male Admission Date (Current Location): 08/26/2023  [facility] and [place] Number:  Chiropodist and Address:  [facility], [address] [place], [place] [address]      Provider Number: [id number]  Attending Physician Name and Address:  Leesa Kast, DO  Relative Name and Phone Number:  Virat Prather [phone]    Current Level of Care: Hospital Recommended Level of Care: Skilled Nursing Facility Prior Approval Number:    Date Approved/Denied:   PASRR Number: [id number]  Discharge Plan: SNF    Current Diagnoses: Patient Active Problem List   Diagnosis Date Noted   Urinary retention 08/26/2023   Hyponatremia 08/26/2023   Generalized weakness 08/26/2023   BPH (benign prostatic hyperplasia) 08/26/2023   Anxiety 08/26/2023   Acute deep vein thrombosis (DVT) of calf muscle vein of right lower extremity (HCC) 08/26/2023   Acute lower UTI 08/26/2023   Dysphagia 10/10/2012   Routine general medical examination at a health care facility 03/21/2012   Elevated PSA 09/24/2010   ELBOW PAIN, LEFT 01/05/2010   Constipation 11/03/2009   History of colonic polyps 11/03/2009   Disorder of skin or subcutaneous tissue 08/28/2009   Allergic rhinitis 08/01/2009   Backache 08/01/2009   Brachial neuritis or radiculitis 11/09/2007   HYPERLIPIDEMIA 11/08/2007   HYPERTROPHY PROSTATE W/UR OBST & OTH LUTS 04/27/2007   DERMATITIS, OTHER ATOPIC 11/09/2006   Insomnia, unspecified 08/19/2006    Orientation RESPIRATION BLADDER Height & Weight     Self, Time, Situation  Normal  (Urinary Catheter) Weight: 188 lb (85.3 kg) Height:  5' 11 (180.3 cm)  BEHAVIORAL SYMPTOMS/MOOD NEUROLOGICAL BOWEL NUTRITION STATUS     (Dementia) Continent Diet  AMBULATORY STATUS COMMUNICATION OF NEEDS Skin   Supervision Verbally Normal                        Personal Care Assistance Level of Assistance  Bathing, Feeding, Dressing Bathing Assistance: Limited assistance Feeding assistance: Limited assistance Dressing Assistance: Limited assistance     Functional Limitations Info  Sight, Hearing, Speech          SPECIAL CARE FACTORS FREQUENCY  PT (By licensed PT), OT (By licensed OT)     PT Frequency: 3x OT Frequency: 3x            Contractures Contractures Info: Not present    Additional Factors Info  Code Status, Allergies Code Status Info: FULL Allergies Info: Oxycodone ; Ciprofloxacin; Sulfa  Antibiotics           Current Medications (08/27/2023):  This is the current hospital active medication list Current Facility-Administered Medications  Medication Dose Route Frequency Provider Last Rate Last Admin   acetaminophen  (TYLENOL ) tablet 650 mg  650 mg Oral Q6H PRN Mansy, Jan A, MD   650 mg at 08/26/23 2312   Or   acetaminophen  (TYLENOL ) suppository 650 mg  650 mg Rectal Q6H PRN Mansy, Jan A, MD       apixaban  (ELIQUIS ) tablet 5 mg  5 mg Oral BID Mansy, Jan A, MD   5 mg at 08/27/23 1013   B-complex with vitamin C  tablet 1 tablet  1 tablet Oral Daily Mansy, Jan A, MD   1 tablet at 08/27/23 1013   cefTRIAXone  (ROCEPHIN ) 1 g in sodium chloride  0.9 % 100 mL IVPB  1 g Intravenous Q24H Mansy, Madison LABOR, MD  feeding supplement (ENSURE PLUS HIGH PROTEIN) liquid 237 mL  237 mL Oral BID BM Mansy, Jan A, MD   237 mL at 08/27/23 1018   magnesium  hydroxide (MILK OF MAGNESIA) suspension 30 mL  30 mL Oral Daily PRN Mansy, Jan A, MD       multivitamin with minerals tablet 1 tablet  1 tablet Oral Daily Mansy, Jan A, MD   1 tablet at 08/27/23 1013   omega-3 acid ethyl esters (LOVAZA ) capsule 1 g  1 g Oral Daily Mansy, Jan A, MD   1 g at 08/27/23 1013   ondansetron  (ZOFRAN ) tablet 4 mg  4 mg Oral Q6H PRN Mansy, Jan A, MD       Or   ondansetron  (ZOFRAN ) injection 4 mg  4 mg Intravenous Q6H PRN Mansy, Jan A, MD       traMADol   (ULTRAM ) tablet 50 mg  50 mg Oral Q12H PRN Dezii, Alexandra, DO   50 mg at 08/27/23 1013   traZODone  (DESYREL ) tablet 25 mg  25 mg Oral QHS PRN Mansy, Madison LABOR, MD         Discharge Medications: Please see discharge summary for a list of discharge medications.  Relevant Imaging Results:  Relevant Lab Results:   Additional Information [id number]  [place] L Cassara Nida, LCSW

## 2023-08-27 NOTE — Evaluation (Signed)
 Physical Therapy Evaluation Patient Details Name: ZAYVION STAILEY MRN: [id number] DOB: 07-08-1950 Today's Date: 08/27/2023  History of Present Illness  Pt admitted to [facility] on 08/26/23 under observation for c/o dysuria and urinary retention; recent indwelling cath placement (7/20-8/7). Labs significant for hyponatremia and hypocholremia. Significant PMH includes: BPH, dyslipidemia, chronic back pain, R TKA (7/17), RLE DVT (7/25) on Eliquis , allergic to oxycodone .   Clinical Impression  Pt received in supine and is agreeable for PT eval. At baseline, pt is mod I for ADL's, med management, and ambulation with RW; spouse assists with IADL's, transportation, foley management. Per spouse, pt with baseline cognitive deficit.  Pt presents with impaired safety awareness, short term memory deficits, decreased standing balance, decreased activity tolerance, impaired skin integrity, and increased pain levels, resulting in impaired functional mobility from baseline. Due to deficits, pt required supervision for bed mobility, transfers, and to ambulate 36ft x2 with RW. Gait deficits, poor safety awareness, and decreased activity tolerance increase the pt's risk of falls. He requires increased cueing for safety with stair navigation at CGA level, step to pattern, and UUE support on railing.   Deficits limit the pt's ability to safely and independently perform ADL's, transfer, and ambulate. Pt will benefit from acute skilled PT services to address deficits for return to baseline function. Pt will benefit from post acute therapy services to address deficits for return to baseline function.         If plan is discharge home, recommend the following: A little help with walking and/or transfers;A little help with bathing/dressing/bathroom;Help with stairs or ramp for entrance;Direct supervision/assist for medications management   Can travel by private vehicle   Yes    Equipment Recommendations None recommended by  PT  Recommendations for Other Services       Functional Status Assessment Patient has had a recent decline in their functional status and demonstrates the ability to make significant improvements in function in a reasonable and predictable amount of time.     Precautions / Restrictions Precautions Precautions: Fall Recall of Precautions/Restrictions: Intact Restrictions Weight Bearing Restrictions Per Provider Order: Yes RLE Weight Bearing Per Provider Order: Weight bearing as tolerated Other Position/Activity Restrictions: TKA 7/17      Mobility  Bed Mobility               General bed mobility comments: supervision for safety to exit bed, BUE for support, increased time/effort for BLE facilitation; supervision for anterior scooting towards EOB, BUE for support, increased time/effort    Transfers                   General transfer comment: supervision for safety for transfers from elevated EOB and to recliner with RW, max multimodal cues for safety, sequencing, hand placement, RW proximity, and attention to task. demonstrates fair eccentric lowering with proper hand placement.    Ambulation/Gait   Gait Distance (Feet): 110 Feet (38ft x2)           General Gait Details: Ambulates to/from stairwell with RW at supervision level for safety and line management. Demonstrates decreased step length/foot clearance bil with slowed cadence. He demonstrates poor safety awareness looking down at his feet to ambulate, and runs into closed door with RW  Stairs Stairs: Yes Stairs assistance: Contact guard assist Stair Management: Step to pattern Number of Stairs: 2 General stair comments: Requires assist for RW management as he attempts to navigate steps with RW. CGA for safety to navigate 2 steps with appropriate sequencing and  UUE support on railing     Balance Overall balance assessment: Modified Independent (standing balance with RW)                                            Pertinent Vitals/Pain Pain Assessment Pain Assessment: 0-10 Pain Score: 5  Pain Location: knee Pain Descriptors / Indicators: Aching (exacerbates to sharp with movement) Pain Intervention(s): Limited activity within patient's tolerance, Monitored during session, Repositioned    Home Living Family/patient expects to be discharged to:: Private residence Living Arrangements: Spouse/significant other   Type of Home: House Home Access: Stairs to enter   [employer] of Steps: front (4-5 steps, bil railing can reach at same time); garage (2 steps, R side)   Home Layout: One level Home Equipment: Grab bars - tub/shower;Grab bars - toilet;Rolling Walker (2 wheels);Cane - single point;Shower seat      Prior Function               Mobility Comments: Amb with RW, was recently using walls/furnitue for steayding over past week in the home. Continues to use RW outside the home. Sleeps in semi-electric bed. ADLs Comments: Mod I for ADL's, medication management; family assists with transportation and IADL's     Extremity/Trunk Assessment   Upper Extremity Assessment Upper Extremity Assessment: Overall WFL for tasks assessed    Lower Extremity Assessment Lower Extremity Assessment: Overall WFL for tasks assessed (mild RLE weakness secondary to acuity of TKA)       Communication   Communication Communication: No apparent difficulties    Cognition Arousal: Alert (cues for time) Behavior During Therapy: WFL for tasks assessed/performed   PT - Cognitive impairments: History of cognitive impairments, Memory, Safety/Judgement                       PT - Cognition Comments: alzheimers per spouse; perseveration on needing pain medication and being allergic to oxycodone  although concern had been addressed multiple times during session Following commands: Intact       Cueing Cueing Techniques: Verbal cues, Tactile cues, Visual cues      General Comments General comments (skin integrity, edema, etc.): bruising to distal RLE, appropriately healing R TKA incision; continued erythema and tenderness to R calf with recent dx of DVT 7/25 (now on eliquis )    Exercises Other Exercises Other Exercises: Pt and spouse edu re: PT role/POC, DC recommendations, safety with functional mobility, call for help, Eliquis  and DVT. They verbalized understanding.   Assessment/Plan    PT Assessment Patient needs continued PT services  PT Problem List Decreased strength;Decreased activity tolerance;Decreased balance;Decreased mobility;Decreased safety awareness;Pain       PT Treatment Interventions DME instruction;Gait training;Stair training;Functional mobility training;Therapeutic activities;Therapeutic exercise;Balance training;Neuromuscular re-education;Patient/family education    PT Goals (Current goals can be found in the Care Plan section)  Acute Rehab PT Goals Patient Stated Goal: short term rehab PT Goal Formulation: With patient/family Time For Goal Achievement: 09/10/23 Potential to Achieve Goals: Fair    Frequency Min 2X/week        AM-PAC PT 6 Clicks Mobility  Outcome Measure Help needed turning from your back to your side while in a flat bed without using bedrails?: A Little Help needed moving from lying on your back to sitting on the side of a flat bed without using bedrails?: A Little Help needed moving to and  from a bed to a chair (including a wheelchair)?: A Little Help needed standing up from a chair using your arms (e.g., wheelchair or bedside chair)?: A Little Help needed to walk in hospital room?: A Little Help needed climbing 3-5 steps with a railing? : A Little 6 Click Score: 18    End of Session Equipment Utilized During Treatment: Gait belt Activity Tolerance: Patient tolerated treatment well Patient left: in chair;with call bell/phone within reach;with chair alarm set;with nursing/sitter in room;with  family/visitor present Nurse Communication: Mobility status PT Visit Diagnosis: Unsteadiness on feet (R26.81);Difficulty in walking, not elsewhere classified (R26.2);Pain Pain - Right/Left: Right Pain - part of body: Knee    Time: 8971-8945 PT Time Calculation (min) (ACUTE ONLY): 26 min   Charges:   PT Evaluation $PT Eval Moderate Complexity: 1 Mod PT Treatments $Therapeutic Activity: 8-22 mins PT General Charges $$ ACUTE PT VISIT: 1 Visit         Camie CHARLENA Kluver, PT, DPT 12:19 PM,08/27/23 Physical Therapist - [facility]

## 2023-08-27 NOTE — TOC Progression Note (Addendum)
 Transition of Care [facility]) - Progression Note    Patient Details  Name: Lucas Robinson MRN: [id number] Date of Birth: 01-Oct-1950  Transition of Care [facility]) CM/SW Contact  [place] L Brendaly [place], [place] Phone Number: 08/27/2023, 12:11 PM  Clinical Narrative:     CSW spoke with spouse regarding discharge planning. Mrs. Zeisler advised that she needed assistance for her husband in the evenings. She stated that she can handle the day time. Mrs. Toppin was advised that insurance was not likely to approve an auth for SNF. She reported having this conversation with the attending.   Resources for [employer], [place] and medicare respite was provided to her. Mrs. Falzone advised that she needed a few days to prepare. CSW reminded Mrs. Bulluck that her husband is medically ready for discharge.   Mrs. Loadholt advised that she would like bed searches for [facility], [facility], [facility] and [facility].   3:54pm: CSW received a call from patient's daughter, Rosina. Bed search and insurance authorization discussed. Rosina stated that family is willing to pay OOP. CSW encouraged family to contact the facilities that they are interested in and speak with the admissions coordinator about private pay options as it may not be likely that insurance will authorize patient to be placed in a SNF. CSW advised that the FL2 is completed and Rosina can contact CSW to notify which facilities they would like the FL2 to be sent to.                    Expected Discharge Plan and Services                                               Social Drivers of Health (SDOH) Interventions SDOH Screenings   Food Insecurity: No Food Insecurity (08/26/2023)  Housing: Low Risk  (08/26/2023)  Transportation Needs: No Transportation Needs (08/26/2023)  Utilities: Not At Risk (08/26/2023)  Financial Resource Strain: Low Risk  (12/20/2022)   Received from [facility]  Social Connections: Socially  Integrated (08/26/2023)  Tobacco Use: Low Risk  (08/26/2023)    Readmission Risk Interventions     No data to display

## 2023-08-27 NOTE — Plan of Care (Signed)

## 2023-08-27 NOTE — Discharge Instructions (Signed)
 [facility]  [address] [place], [place] [address] [phone]  [email or web address]    [facility] Advisor [phone]  [email or web address]  [email or web address]

## 2023-08-27 NOTE — Progress Notes (Signed)
 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  He was discharged with a Foley catheter and had unsuccessful Foley catheter removal outpatient requiring reinsertion.  Patient then underwent catheter removal again via [facility] on 8/8.  He presents to the ED on 8/8 with urinary retention.  Vitals were unremarkable, labs revealed hyponatremia and hypochloremia.  Urinalysis revealed nitrates with rare bacteria.  Foley catheter was placed successfully and patient was admitted for monitoring.  Subjective: Patient has no acute complaints this morning.  He denies any pain.  His wife endorses significant concerns about patient returning home.  She reports that his dementia is making it difficult to manage his health care at home.  She is specifically concerned about nighttime.  They are working on making modifications to their home to allow for more caregiver assistance.   Objective: Vitals:   08/26/23 2058 08/26/23 2239 08/27/23 0306 08/27/23 1432  BP: (!) 151/93 (!) 152/90 118/73 116/75  Pulse: 76 90 73 69  Resp: 16 20 16 16   Temp: 99.2 F (37.3 C) 98.4 F (36.9 C) 97.9 F (36.6 C) 98 F (36.7 C)  TempSrc: Oral Oral Oral Oral  SpO2: 100% 100% 97% 99%  Weight: 85.3 kg     Height: 5' 11 (1.803 m)       Intake/Output Summary (Last 24 hours) at 08/27/2023 1507 Last data filed at 08/27/2023 1352 Gross per 24 hour  Intake 2471.51 ml  Output 7600 ml  Net -5128.49 ml   Filed Weights   08/26/23 2058  Weight: 85.3 kg    Examination: General exam: Appears calm and comfortable, NAD  Respiratory system: No work of breathing, symmetric chest wall expansion Cardiovascular system: S1 & S2 heard,  RRR.  Gastrointestinal system: Abdomen is nondistended, soft and nontender.  Neuro: Alert.  Oriented to self, situation, place.  Disoriented to year.  Does require some repetition after demonstrating he forgot previously discussed conversation. Extremities: Symmetric, expected ROM Skin: No rashes, lesions Psychiatry: Mood & affect appropriate for situation.   Assessment & Plan:  Principal Problem:   Urinary retention Active Problems:   Hyponatremia   Generalized weakness   Acute lower UTI   Acute deep vein thrombosis (DVT) of calf muscle vein of right lower extremity (HCC)   BPH (benign prostatic hyperplasia)   Anxiety    Urinary retention - Patient has failed trial of void multiple times outpatient - New Foley catheter placed on 8/8.  He will need to be discharged with this Foley catheter - No acute needs for urology.  Can follow-up outpatient with [facility] with whom he is established  Hyponatremia - Secondary to urinary retention.  Has already resolved to normal on repeat with minimal intervention - Trend CMP  Possible UTI - Plan ceftriaxone , will follow urine cultures.  Lower urinary tract symptoms obscured given profound retention on arrival  Generalized weakness - Acute on chronic problem.  Recent TKA 7/17 and has been undergoing physical therapy - Does have home health established - PT recommending home health at discharge  DVT, present on arrival - Diagnosis 1/25.  On Eliquis .  Continue home dose  BPH - Continue home meds and Foley catheter  Anxiety - Continue home meds  Alzheimer's dementia - Continue home meds  Caregiver fatigue - His wife is endorsing need for respite care.  She reports she is not able to meet his health needs at this time.  Most specifically her concerns are about his nighttime weakness.  TOC has been consulted to assist with resource management. - On Eliquis    DVT prophylaxis: eliquis    Code Status: Full Code Disposition:   Pending SNF vs Respite care placement. Medically cleared to DC   Consultants:    Procedures:    Antimicrobials:  Anti-infectives (From admission, onward)    Start     Dose/Rate Route Frequency Ordered Stop   08/27/23 2200  cefTRIAXone  (ROCEPHIN ) 1 g in sodium chloride  0.9 % 100 mL IVPB        1 g 200 mL/hr over 30 Minutes Intravenous Every 24 hours 08/26/23 2336     08/26/23 2130  cefTRIAXone  (ROCEPHIN ) 1 g in sodium chloride  0.9 % 100 mL IVPB        1 g 200 mL/hr over 30 Minutes Intravenous  Once 08/26/23 2126 08/26/23 2222       Data Reviewed: I have personally reviewed following labs and imaging studies CBC: Recent Labs  Lab 08/26/23 1814 08/27/23 0611  WBC 12.3* 7.4  HGB 14.0 12.2*  HCT 40.7 36.7*  MCV 86.0 86.8  PLT 450* 345   Basic Metabolic Panel: Recent Labs  Lab 08/26/23 1814 08/27/23 0611  NA 122* 139  K 4.3 3.9  CL 87* 104  CO2 20* 25  GLUCOSE 135* 97  BUN 13 11  CREATININE 0.99 0.73  CALCIUM 9.9 9.0   GFR: Estimated Creatinine Clearance: 86.3 mL/min (by C-G formula based on SCr of 0.73 mg/dL). Liver Function Tests: No results for input(s): AST, ALT, ALKPHOS, BILITOT, PROT, ALBUMIN in the last 168 hours. CBG: No results for input(s): GLUCAP in the last 168 hours.  No results found for this or any previous visit (from the past 240 hours).   Radiology Studies: No results found.  Scheduled Meds:  apixaban   5 mg Oral BID   B-complex with vitamin C   1 tablet Oral Daily   feeding supplement  237 mL Oral BID BM   multivitamin with minerals  1 tablet Oral Daily   omega-3 acid ethyl esters  1 g Oral Daily   Continuous Infusions:  cefTRIAXone  (ROCEPHIN )  IV       LOS: 0 days  MDM: Patient is high risk for one or more organ failure.  They necessitate ongoing hospitalization for continued IV therapies and subsequent lab monitoring. Total time spent interpreting labs and vitals, reviewing the medical record, coordinating care amongst  consultants and care team members, directly assessing and discussing care with the patient and/or family: 55 min Lucas Gullikson, DO [facility]  To contact the attending physician between 7A-7P please use Epic Chat. To contact the covering physician during after hours 7P-7A, please review Amion.  08/27/2023, 3:07 PM   *This document has been created with the assistance of dictation software. Please excuse typographical errors. *

## 2023-08-28 DIAGNOSIS — R339 Retention of urine, unspecified: Secondary | ICD-10-CM | POA: Diagnosis not present

## 2023-08-28 MED ORDER — CHLORHEXIDINE GLUCONATE CLOTH 2 % EX PADS
6.0000 | MEDICATED_PAD | Freq: Every day | CUTANEOUS | Status: DC
  Administered 2023-08-28 – 2023-08-29 (×3): 6 via TOPICAL

## 2023-08-28 NOTE — Progress Notes (Signed)
 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  He was discharged with a Foley catheter and had unsuccessful Foley catheter removal outpatient requiring reinsertion.  Patient then underwent catheter removal again via [facility] on 8/8.  He presents to the ED on 8/8 with urinary retention.  Vitals were unremarkable, labs revealed hyponatremia and hypochloremia.  Urinalysis revealed nitrates with rare bacteria.  Foley catheter was placed successfully and patient was admitted for monitoring.  Subjective: No acute events overnight.  Patient is with his sons this morning.  He has been ambulating around the unit with assistance and walker.  He is anxious to take a shower today.  Objective: Vitals:   08/27/23 2025 08/28/23 0410 08/28/23 0818 08/28/23 1623  BP: 137/87 (!) 144/81 (!) 155/93 (!) 146/99  Pulse: 79 64 60 78  Resp: 18 18 18 18   Temp: 98.1 F (36.7 C) 97.9 F (36.6 C) 97.7 F (36.5 C) 98.5 F (36.9 C)  TempSrc: Oral Oral    SpO2: 100% 97% 96% 98%  Weight:      Height:        Intake/Output Summary (Last 24 hours) at 08/28/2023 1636 Last data filed at 08/28/2023 1238 Gross per 24 hour  Intake 102.23 ml  Output 4550 ml  Net -4447.77 ml   Filed Weights   08/26/23 2058  Weight: 85.3 kg    Examination: General exam: Appears calm and comfortable, NAD  Respiratory system: No work of breathing, symmetric chest wall expansion Cardiovascular system: S1 & S2 heard, RRR.  Gastrointestinal system: Abdomen is nondistended, soft and nontender.  Neuro: Alert.  Oriented to self, situation, place.  Disoriented to year.  Does require some repetition after demonstrating he forgot previously  discussed conversation. Extremities: Symmetric, expected ROM Skin: No rashes, lesions Psychiatry: Mood & affect appropriate for situation.   Assessment & Plan:  Principal Problem:   Urinary retention Active Problems:   Hyponatremia   Generalized weakness   Acute lower UTI   Acute deep vein thrombosis (DVT) of calf muscle vein of right lower extremity (HCC)   BPH (benign prostatic hyperplasia)   Anxiety    Urinary retention - Patient has failed trial of void multiple times outpatient - New Foley catheter placed on 8/8.  He will need to be discharged with this Foley catheter.  Recommend we keep Foley catheter in place for at least 30 days as he has failed trial of void 3 times. - No acute needs for urology.  Can follow-up outpatient with [facility] with whom he is established  Hyponatremia - Secondary to urinary retention.  Has already resolved to normal on repeat with minimal intervention - Trend CMP  Possible UTI - 3 days ceftriaxone , does not appear urine cultures were sent on admission.  Will have little yield now after 48 hours of antibiotics.  Lower urinary tract symptoms obscured given profound retention on arrival  Generalized weakness - Acute on chronic problem.  Recent TKA 7/17 and has been undergoing physical therapy - Does have home health established - PT recommending home health at discharge  DVT, present on arrival - Diagnosis 1/25.  On Eliquis .  Continue home dose  BPH - Continue home meds and Foley catheter  Anxiety - Continue home meds  Alzheimer's dementia - Continue home meds  Caregiver fatigue - His wife is endorsing need for respite care.  She reports she is not able to meet his health needs at this time.  Most specifically her concerns are about his nighttime weakness.  TOC has been consulted to assist with resource management. - On Eliquis    DVT prophylaxis: eliquis    Code Status: Full Code Disposition: Remains medically cleared for  discharge.  Some difficulty with discharge planning.  Family is discussing out-of-pocket rehab but reports they are unable to get facilities on the phone today.  Hopefully discharge in a.m.  Consultants:    Procedures:    Antimicrobials:  Anti-infectives (From admission, onward)    Start     Dose/Rate Route Frequency Ordered Stop   08/27/23 2200  cefTRIAXone  (ROCEPHIN ) 1 g in sodium chloride  0.9 % 100 mL IVPB        1 g 200 mL/hr over 30 Minutes Intravenous Every 24 hours 08/26/23 2336     08/26/23 2130  cefTRIAXone  (ROCEPHIN ) 1 g in sodium chloride  0.9 % 100 mL IVPB        1 g 200 mL/hr over 30 Minutes Intravenous  Once 08/26/23 2126 08/26/23 2222       Data Reviewed: I have personally reviewed following labs and imaging studies CBC: Recent Labs  Lab 08/26/23 1814 08/27/23 0611  WBC 12.3* 7.4  HGB 14.0 12.2*  HCT 40.7 36.7*  MCV 86.0 86.8  PLT 450* 345   Basic Metabolic Panel: Recent Labs  Lab 08/26/23 1814 08/27/23 0611  NA 122* 139  K 4.3 3.9  CL 87* 104  CO2 20* 25  GLUCOSE 135* 97  BUN 13 11  CREATININE 0.99 0.73  CALCIUM 9.9 9.0   GFR: Estimated Creatinine Clearance: 86.3 mL/min (by C-G formula based on SCr of 0.73 mg/dL). Liver Function Tests: No results for input(s): AST, ALT, ALKPHOS, BILITOT, PROT, ALBUMIN in the last 168 hours. CBG: No results for input(s): GLUCAP in the last 168 hours.  No results found for this or any previous visit (from the past 240 hours).   Radiology Studies: No results found.  Scheduled Meds:  apixaban   5 mg Oral BID   B-complex with vitamin C   1 tablet Oral Daily   Chlorhexidine  Gluconate Cloth  6 each Topical Daily   feeding supplement  237 mL Oral BID BM   finasteride   5 mg Oral Daily   memantine   10 mg Oral BID   multivitamin with minerals  1 tablet Oral Daily   omega-3 acid ethyl esters  1 g Oral Daily   tamsulosin   0.4 mg Oral QPC supper   Continuous Infusions:  cefTRIAXone  (ROCEPHIN )  IV  Stopped (08/27/23 2238)     LOS: 1 day  MDM: Patient is high risk for one or more organ failure.  They necessitate ongoing hospitalization for continued IV therapies and subsequent lab monitoring. Total time spent interpreting labs and vitals, reviewing the medical record, coordinating care amongst consultants and care team members, directly assessing and discussing care with the patient and/or family: 55 min Merion Grimaldo, DO [facility]  To contact the attending physician between 7A-7P please use Epic Chat. To contact the covering physician during after hours 7P-7A, please review Amion.  08/28/2023, 4:36 PM   *This document has been created with the assistance of dictation software. Please excuse typographical errors. *

## 2023-08-28 NOTE — Plan of Care (Signed)

## 2023-08-28 NOTE — Plan of Care (Signed)
   Problem: Education: Goal: Knowledge of General Education information will improve Description: Including pain rating scale, medication(s)/side effects and non-pharmacologic comfort measures Outcome: Progressing   Problem: Clinical Measurements: Goal: Ability to maintain clinical measurements within normal limits will improve Outcome: Progressing Goal: Will remain free from infection Outcome: Progressing

## 2023-08-29 DIAGNOSIS — R339 Retention of urine, unspecified: Secondary | ICD-10-CM | POA: Diagnosis not present

## 2023-08-29 LAB — T4: T4, Total: 9.2 ug/dL (ref 4.5–12.0)

## 2023-08-29 MED ORDER — TRAMADOL HCL 50 MG PO TABS: 50.0000 mg | ORAL_TABLET | Freq: Two times a day (BID) | ORAL | 0 refills | Status: AC | PRN

## 2023-08-29 NOTE — Plan of Care (Signed)
   Problem: Education: Goal: Knowledge of General Education information will improve Description: Including pain rating scale, medication(s)/side effects and non-pharmacologic comfort measures Outcome: Progressing   Problem: Clinical Measurements: Goal: Ability to maintain clinical measurements within normal limits will improve Outcome: Progressing Goal: Will remain free from infection Outcome: Progressing

## 2023-08-29 NOTE — Care Management Important Message (Signed)
 Important Message  Patient Details  Name: Lucas Robinson MRN: [id number] Date of Birth: [birth year]   Important Message Given:  Yes - Medicare IM     Lucas Robinson 08/29/2023, 12:36 PM

## 2023-08-29 NOTE — TOC Transition Note (Signed)
 Transition of Care [facility]) - Discharge Note   Patient Details  Name: Lucas Robinson MRN: [id number] Date of Birth: 11/21/1950  Transition [facility] Care [facility]) CM/SW Contact:  [name], RN Phone Number: 08/29/2023, 1:38 PM   Clinical Narrative:     Patient does not have 3 night qualifying stay Family to private pay  Patient will DC to: [facility] Anticipated DC date: 08/29/23  Family notified: wife Transport by: Wife Ms Shatto   Per MD patient ready for DC to . RN, , patient's family, and facility notified of DC. Discharge Summary sent to facility. RN given number for report. DC packet on chart. .   TOC signing off.         Patient Goals and CMS Choice            Discharge Placement                       Discharge Plan and Services Additional resources added to the After Visit Summary for                                       Social Drivers of Health (SDOH) Interventions SDOH Screenings   Food Insecurity: No Food Insecurity (08/26/2023)  Housing: Low Risk  (08/26/2023)  Transportation Needs: No Transportation Needs (08/26/2023)  Utilities: Not At Risk (08/26/2023)  Financial Resource Strain: Low Risk  (12/20/2022)   Received from [facility]  Social Connections: Socially Integrated (08/26/2023)  Tobacco Use: Low Risk  (08/26/2023)     Readmission Risk Interventions     No data to display

## 2023-08-29 NOTE — TOC Progression Note (Signed)
 Transition of Care [facility]) - Progression Note    Patient Details  Name: Lucas Robinson MRN: [id number] Date of Birth: [birth year]  [facility]) CM/SW Contact  [name], RN Phone Number: 08/29/2023, 9:41 AM  Clinical Narrative:    Bed search extended Bed offers offered to wife [name].  She accepts be at [facility].  Accepted in HUB and notified [name] at [facility]                                               Social Drivers of Health (SDOH) Interventions SDOH Screenings   Food Insecurity: No Food Insecurity (08/26/2023)  Housing: Low Risk  (08/26/2023)  Transportation Needs: No Transportation Needs (08/26/2023)  Utilities: Not At Risk (08/26/2023)  Financial Resource Strain: Low Risk  (12/20/2022)   [facility] from [facility]  Social Connections: Socially Integrated (08/26/2023)  Tobacco Use: Low Risk  (08/26/2023)    Readmission Risk Interventions     No data to display

## 2023-08-29 NOTE — Discharge Summary (Signed)
 DISCHARGE SUMMARY    Lucas Robinson FMW:[id number] DOB: Oct 03, 1950 DOA: 08/26/2023  PCP: Glover Lenis, MD  Admit date: 08/26/2023 Discharge date: 08/29/2023   Recommendations for Outpatient Follow-up:  Follow up with PCP, orthopedic surgery, urology as scheduled Discharge directly to skilled nursing facility.  Further medication management per their medical team   Hospital Course: Lucas Robinson is a 73 year old male with BPH, dyslipidemia, chronic back pain, Alzheimer dementia, recent TKA on 10/17 complicated by urinary retention.  He was discharged with a Foley catheter and had unsuccessful Foley catheter removal outpatient requiring reinsertion.  Patient then underwent catheter removal again via [facility] on 8/8.  He presents to the ED on 8/8 with urinary retention.  Vitals were unremarkable, labs revealed hyponatremia and hypochloremia.  Urinalysis revealed nitrates with rare bacteria.  Foley catheter was placed successfully and patient was admitted for monitoring. Hyponatremia resolved spontaneously.  Was likely secondary to urinary retention.  Patient quickly resumed to his mental status baseline and had no further issues.  He worked with physical therapy who recommended skilled nursing facility and this was arranged by TOC.  He is discharging directly to SNF today.   Urinary retention - Patient has failed trial of void multiple times outpatient - New Foley catheter placed on 8/8.  He will need to be discharged with this Foley catheter.  Recommend we keep Foley catheter in place for at least 30 days as he has failed trial of void 3 times. - No acute needs for urology.  Can follow-up outpatient with [facility] with whom he is established   Hyponatremia - Secondary to urinary retention.  Has already resolved to normal on repeat with minimal intervention   Possible UTI - s/p 3 days ceftriaxone , does not appear urine cultures were sent on admission.  Will have little yield  now after 48 hours of antibiotics.  Lower urinary tract symptoms obscured given profound retention on arrival   Generalized weakness - Acute on chronic problem.  Recent TKA 7/17 and has been undergoing physical therapy - SNF at [place]   DVT, present on arrival - Diagnosis 7/25.  On Eliquis .  Continue home dose   BPH - Continue home meds and Foley catheter   Anxiety - Continue home meds   Alzheimer's dementia - Continue home meds   Caregiver fatigue - His wife is endorsing need for respite care.  She reports she is not able to meet his health needs at this time.  Most specifically her concerns are about his nighttime weakness.     Discharge Instructions  Discharge Instructions     Call MD for:  difficulty breathing, headache or visual disturbances   Complete by: As directed    Call MD for:  persistant dizziness or light-headedness   Complete by: As directed    Call MD for:  persistant nausea and vomiting   Complete by: As directed    Call MD for:  severe uncontrolled pain   Complete by: As directed    Call MD for:  temperature >100.4   Complete by: As directed    Diet general   Complete by: As directed    Discharge instructions   Complete by: As directed    Follow up with your primary care physician to discuss the medication changes during this admission   Increase activity slowly   Complete by: As directed       Allergies as of 08/29/2023       Reactions   Ciprofloxacin Other (See  Comments), Swelling   REACTION: PALPITATIONS Other Reaction(s): Not available ciprofloxacin   Oxycodone  Anxiety, Other (See Comments)   Oxycodone  causes constipation and makes him sick to his stomach. Says that he cannot take narcotics only non-narcs.    Misc. Sulfonamide Containing Compounds Other (See Comments)   Sulfa  Antibiotics Other (See Comments)        Medication List     STOP taking these medications    alfuzosin 10 MG 24 hr tablet Commonly known as: UROXATRAL    ascorbic acid 500 MG tablet Commonly known as: VITAMIN C    azithromycin  500 MG tablet Commonly known as: ZITHROMAX    b complex vitamins tablet   diazepam  5 MG tablet Commonly known as: VALIUM    fish oil-omega-3 fatty acids  1000 MG capsule   L-Lysine 1000 MG Tabs   mometasone  0.1 % cream Commonly known as: ELOCON    multivitamin tablet   oxyCODONE  5 MG immediate release tablet Commonly known as: Roxicodone    PROBIOTIC DAILY PO   Rivaroxaban 15 MG Tabs tablet Commonly known as: XARELTO       TAKE these medications    apixaban  5 MG Tabs tablet Commonly known as: ELIQUIS  Take 1 tablet (5 mg total) by mouth 2 (two) times daily. Take two tablets (10mg ) by mouth 2 (two) times daily for the first 7 days, then take one tablet (5mg ) by mouth 2 (two) times daily.   finasteride  5 MG tablet Commonly known as: PROSCAR  Take 5 mg by mouth daily.   ibuprofen 200 MG tablet Commonly known as: ADVIL Take 200 mg by mouth as needed.   memantine  10 MG tablet Commonly known as: NAMENDA  Take 10 mg by mouth daily.   prednisoLONE acetate 1 % ophthalmic suspension Commonly known as: PRED FORTE Place 1 drop into the left eye 4 (four) times daily.   tamsulosin  0.4 MG Caps capsule Commonly known as: FLOMAX  Take 0.4 mg by mouth daily after supper.   traMADol  50 MG tablet Commonly known as: ULTRAM  Take 50 mg by mouth every 6 (six) hours as needed for moderate pain (pain score 4-6) or severe pain (pain score 7-10).        Contact information for after-discharge care     Destination     [facility] .   Service: Skilled Nursing Contact information: [address] [place]  [phone]                    Allergies  Allergen Reactions   Ciprofloxacin Other (See Comments) and Swelling    REACTION: PALPITATIONS  Other Reaction(s): Not available  ciprofloxacin   Oxycodone  Anxiety and Other (See Comments)     Oxycodone  causes constipation and makes him sick to his stomach. Says that he cannot take narcotics only non-narcs.    Misc. Sulfonamide Containing Compounds Other (See Comments)   Sulfa  Antibiotics Other (See Comments)    Consultations:    Procedures/Studies: CT Angio Chest PE W and/or Wo Contrast Result Date: 08/13/2023 CLINICAL DATA:  Shortness of breath. Right calf vein DVT diagnosed on 08/12/2023. EXAM: CT ANGIOGRAPHY CHEST WITH CONTRAST TECHNIQUE: Multidetector CT imaging of the chest was performed using the standard protocol during bolus administration of intravenous contrast. Multiplanar CT image reconstructions and MIPs were obtained to evaluate the vascular anatomy. RADIATION DOSE REDUCTION: This exam was performed according to the departmental dose-optimization program which includes automated exposure control, adjustment of the mA and/or kV according to patient size and/or use of iterative  reconstruction technique. CONTRAST:  75mL OMNIPAQUE  IOHEXOL  350 MG/ML SOLN COMPARISON:  Chest radiograph 08/13/2023 FINDINGS: Cardiovascular: No filling defect is identified in the pulmonary arterial tree to suggest pulmonary embolus. Coronary, aortic arch, and branch vessel atherosclerotic vascular disease. Mild cardiomegaly. Ascending aortic aneurysm 4.4 cm in diameter on image 165 series 5. Mediastinum/Nodes: Unremarkable Lungs/Pleura: Unremarkable Upper Abdomen: Unremarkable Musculoskeletal: Lower cervical plate and screw fixator. Thoracic spondylosis. Mild grade 1 degenerative anterolisthesis at C7-T1. Review of the MIP images confirms the above findings. IMPRESSION: 1. No filling defect is identified in the pulmonary arterial tree to suggest pulmonary embolus. 2. Ascending aortic aneurysm 4.4 cm in diameter. Recommend annual imaging followup by CTA or MRA. This recommendation follows 2010 ACCF/AHA/AATS/ACR/ASA/SCA/SCAI/SIR/STS/SVM Guidelines for the Diagnosis and Management of Patients with Thoracic  Aortic Disease. Circulation. 2010; 121: Z733-z630. Aortic aneurysm NOS (ICD10-I71.9) 3. Mild cardiomegaly. 4. Thoracic spondylosis. Mild grade 1 degenerative anterolisthesis at C7-T1. 5.  Aortic Atherosclerosis (ICD10-I70.0). Electronically Signed   By: Ryan Salvage M.D.   On: 08/13/2023 16:18   DG Chest 2 View Result Date: 08/13/2023 CLINICAL DATA:  Shortness of breath. EXAM: CHEST - 2 VIEW COMPARISON:  Chest radiograph dated 11/04/2021. FINDINGS: No focal consolidation, pleural effusion, or pneumothorax. The cardiac silhouette is within normal limits. No acute osseous pathology. Degenerative changes of the spine. IMPRESSION: No active cardiopulmonary disease. Electronically Signed   By: Vanetta Chou M.D.   On: 08/13/2023 14:33   US  Venous Img Lower Unilateral Right (DVT) Result Date: 08/12/2023 CLINICAL DATA:  Localized swelling EXAM: Right LOWER EXTREMITY VENOUS DOPPLER ULTRASOUND TECHNIQUE: Gray-scale sonography with compression, as well as color and duplex ultrasound, were performed to evaluate the deep venous system(s) from the level of the common femoral vein through the popliteal and proximal calf veins. COMPARISON:  None Available. FINDINGS: VENOUS Normal compressibility of the common femoral, superficial femoral, and popliteal veins. Occlusive thrombus within the right peroneal vein at the mid to distal calf. Noncompressible. Visualized portions of profunda femoral vein and great saphenous vein unremarkable. Doppler waveforms show normal direction of venous flow, normal respiratory plasticity and response to augmentation within the patent vessels. Limited views of the contralateral common femoral vein are unremarkable. OTHER None. Limitations: none IMPRESSION: Positive for occlusive deep venous thrombosis within the right peroneal vein at the mid to distal calf. These results will be called to the ordering clinician or representative by the Radiologist Assistant, and communication  documented in the PACS or [employer]. Electronically Signed   By: Luke Bun M.D.   On: 08/12/2023 16:06      Discharge Exam: Vitals:   08/29/23 0358 08/29/23 0735  BP: 139/80 (!) 143/97  Pulse: 68 62  Resp: 16 18  Temp: 98.3 F (36.8 C) 98.1 F (36.7 C)  SpO2: 97% 100%   Vitals:   08/28/23 1623 08/28/23 1952 08/29/23 0358 08/29/23 0735  BP: (!) 146/99 (!) 146/85 139/80 (!) 143/97  Pulse: 78 80 68 62  Resp: 18 16 16 18   Temp: 98.5 F (36.9 C) 98.6 F (37 C) 98.3 F (36.8 C) 98.1 F (36.7 C)  TempSrc:  Oral Oral Oral  SpO2: 98% 99% 97% 100%  Weight:      Height:        Constitutional:  Normal appearance. Non toxic-appearing.  HENT: Head Normocephalic and atraumatic.  Mucous membranes are moist.  Eyes:  Extraocular intact. Conjunctivae normal.  Cardiovascular: Rate and Rhythm: Normal rate and regular rhythm.  Pulmonary: Non labored, symmetric rise of chest wall.  Skin: warm and dry. not jaundiced.  Neurological: Alert. Oriented to self, situation, place. Does require some repetition after demonstrating he forgot previously discussed conversation.  Psychiatric: Mood and Affect congruent.    The results of significant diagnostics from this hospitalization (including imaging, microbiology, ancillary and laboratory) are listed below for reference.     Microbiology: No results found for this or any previous visit (from the past 240 hours).   Labs: BNP (last 3 results) No results for input(s): BNP in the last 8760 hours. Basic Metabolic Panel: Recent Labs  Lab 08/26/23 1814 08/27/23 0611  NA 122* 139  K 4.3 3.9  CL 87* 104  CO2 20* 25  GLUCOSE 135* 97  BUN 13 11  CREATININE 0.99 0.73  CALCIUM 9.9 9.0   Liver Function Tests: No results for input(s): AST, ALT, ALKPHOS, BILITOT, PROT, ALBUMIN in the last 168 hours. No results for input(s): LIPASE, AMYLASE in the last 168 hours. No results for input(s): AMMONIA in the last 168  hours. CBC: Recent Labs  Lab 08/26/23 1814 08/27/23 0611  WBC 12.3* 7.4  HGB 14.0 12.2*  HCT 40.7 36.7*  MCV 86.0 86.8  PLT 450* 345   Cardiac Enzymes: No results for input(s): CKTOTAL, CKMB, CKMBINDEX, TROPONINI in the last 168 hours. BNP: Invalid input(s): POCBNP CBG: No results for input(s): GLUCAP in the last 168 hours. D-Dimer No results for input(s): DDIMER in the last 72 hours. Hgb A1c No results for input(s): HGBA1C in the last 72 hours. Lipid Profile No results for input(s): CHOL, HDL, LDLCALC, TRIG, CHOLHDL, LDLDIRECT in the last 72 hours. Thyroid function studies Recent Labs    08/26/23 1814 08/27/23 0607  TSH 5.265*  --   T4TOTAL  --  9.2   Anemia work up No results for input(s): VITAMINB12, FOLATE, FERRITIN, TIBC, IRON, RETICCTPCT in the last 72 hours. Urinalysis    Component Value Date/Time   COLORURINE AMBER (A) 08/26/2023 2001   APPEARANCEUR CLEAR (A) 08/26/2023 2001   LABSPEC 1.009 08/26/2023 2001   PHURINE 6.0 08/26/2023 2001   GLUCOSEU NEGATIVE 08/26/2023 2001   HGBUR SMALL (A) 08/26/2023 2001   BILIRUBINUR NEGATIVE 08/26/2023 2001   BILIRUBINUR negative 10/13/2010 1527   KETONESUR NEGATIVE 08/26/2023 2001   PROTEINUR NEGATIVE 08/26/2023 2001   UROBILINOGEN 0.2 10/13/2010 1527   NITRITE POSITIVE (A) 08/26/2023 2001   LEUKOCYTESUR NEGATIVE 08/26/2023 2001   Sepsis Labs Recent Labs  Lab 08/26/23 1814 08/27/23 0611  WBC 12.3* 7.4   Microbiology No results found for this or any previous visit (from the past 240 hours).   Time coordinating discharge: 32 min   SIGNED: Blayden Conwell, DO [facility] 08/29/2023, 10:05 AM Pager   If 7PM-7AM, please contact night-coverage

## 2023-09-25 ENCOUNTER — Emergency Department
Admission: EM | Admit: 2023-09-25 | Discharge: 2023-09-25 | Disposition: A | Discharge status: Home / Self Care | Attending: Emergency Medicine | Admitting: Emergency Medicine

## 2023-09-25 ENCOUNTER — Other Ambulatory Visit: Payer: Self-pay

## 2023-09-25 DIAGNOSIS — W228XXA Striking against or struck by other objects, initial encounter: Secondary | ICD-10-CM | POA: Insufficient documentation

## 2023-09-25 DIAGNOSIS — T83511A Infection and inflammatory reaction due to indwelling urethral catheter, initial encounter: Secondary | ICD-10-CM | POA: Insufficient documentation

## 2023-09-25 DIAGNOSIS — Z23 Encounter for immunization: Secondary | ICD-10-CM | POA: Diagnosis not present

## 2023-09-25 DIAGNOSIS — Y732 Prosthetic and other implants, materials and accessory gastroenterology and urology devices associated with adverse incidents: Secondary | ICD-10-CM | POA: Insufficient documentation

## 2023-09-25 DIAGNOSIS — N390 Urinary tract infection, site not specified: Secondary | ICD-10-CM

## 2023-09-25 DIAGNOSIS — Z7901 Long term (current) use of anticoagulants: Secondary | ICD-10-CM | POA: Insufficient documentation

## 2023-09-25 DIAGNOSIS — S51811A Laceration without foreign body of right forearm, initial encounter: Secondary | ICD-10-CM | POA: Insufficient documentation

## 2023-09-25 DIAGNOSIS — S59911A Unspecified injury of right forearm, initial encounter: Secondary | ICD-10-CM | POA: Diagnosis present

## 2023-09-25 LAB — URINALYSIS, ROUTINE W REFLEX MICROSCOPIC
Bacteria, UA: NONE SEEN
Bilirubin Urine: NEGATIVE
Glucose, UA: NEGATIVE mg/dL
Ketones, ur: 20 mg/dL — AB
Nitrite: POSITIVE — AB
Protein, ur: 100 mg/dL — AB
RBC / HPF: >50 RBC/hpf (ref 0–5)
Specific Gravity, Urine: 1.025 (ref 1.005–1.030)
Squamous Epithelial / HPF: 0 /HPF (ref 0–5)
WBC, UA: >50 WBC/hpf (ref 0–5)
pH: 5 (ref 5.0–8.0)

## 2023-09-25 MED ORDER — CEFUROXIME AXETIL 500 MG PO TABS
500.0000 mg | ORAL_TABLET | Freq: Once | ORAL | Status: AC
  Administered 2023-09-25: 500 mg via ORAL
  Filled 2023-09-25: qty 1

## 2023-09-25 MED ORDER — CEFPODOXIME PROXETIL 200 MG PO TABS: 200.0000 mg | ORAL_TABLET | Freq: Two times a day (BID) | ORAL | 0 refills | Status: AC

## 2023-09-25 MED ORDER — TETANUS-DIPHTH-ACELL PERTUSSIS 5-2.5-18.5 LF-MCG/0.5 IM SUSY
0.5000 mL | PREFILLED_SYRINGE | Freq: Once | INTRAMUSCULAR | Status: AC
  Administered 2023-09-25: 0.5 mL via INTRAMUSCULAR
  Filled 2023-09-25: qty 0.5

## 2023-09-25 MED ORDER — LIDOCAINE-EPINEPHRINE-TETRACAINE (LET) TOPICAL GEL
3.0000 mL | Freq: Once | TOPICAL | Status: AC
  Administered 2023-09-25: 3 mL via TOPICAL
  Filled 2023-09-25: qty 3
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
## 2023-09-25 NOTE — Discharge Instructions (Addendum)
 You have been seen in the Emergency Department (ED) today for a laceration (cut).  We were able to close it with skin glue and/or tape.  Please keep the wound dry for about 24 hours.  At that point you can get it wet, in the shower, for example, but do not submerge it in water.  In 1 to 2 weeks the glue and/or tape will start to come off on its own.  Please do not pull it off early, allow it to fall off on its own.  If there are edges that are starting to pull up, you can trim them with a clean pair of small scissors.  Please take Tylenol  (acetaminophen ) or Motrin (ibuprofen) as needed for discomfort as written on the box.   Please follow up with your doctor as soon as possible regarding today's emergent visit. Please also follow-up with the wound care center listed in your paperwork.    You have been seen in the Emergency Department (ED) today for pain when urinating.  Your workup today suggests that you have a urinary tract infection (UTI).  Please take your antibiotic as prescribed and over-the-counter pain medication (Tylenol  or Motrin) as needed, but no more than recommended on the label instructions.  Drink PLENTY of fluids.  Call your regular doctor to schedule the next available appointment to follow up on today's ED visit, or return immediately to the ED if your pain worsens, you have decreased urine production, develop fever, persistent vomiting, or other symptoms that concern you.   Return to the ED or call your doctor if you notice any signs of infection such as fever, increased pain, increased redness, pus, or other symptoms that concern you.
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
## 2023-09-28 ENCOUNTER — Ambulatory Visit: Admitting: Physician Assistant

## 2023-09-28 ENCOUNTER — Ambulatory Visit (INDEPENDENT_AMBULATORY_CARE_PROVIDER_SITE_OTHER): Admitting: Physician Assistant

## 2023-09-28 DIAGNOSIS — N138 Other obstructive and reflux uropathy: Secondary | ICD-10-CM | POA: Diagnosis not present

## 2023-09-28 DIAGNOSIS — R972 Elevated prostate specific antigen [PSA]: Secondary | ICD-10-CM

## 2023-09-28 DIAGNOSIS — N401 Enlarged prostate with lower urinary tract symptoms: Secondary | ICD-10-CM

## 2023-09-28 LAB — BLADDER SCAN AMB NON-IMAGING
Scan Result: 500
Scan Result: 498

## 2023-09-28 LAB — URINE CULTURE: Culture: >=100000 — AB

## 2023-09-28 NOTE — Patient Instructions (Signed)

## 2023-09-28 NOTE — Progress Notes (Signed)
 Catheter Removal  Patient is present today for a catheter removal.  10ml of water was drained from the balloon. A 16FR foley cath was removed from the bladder, no complications were noted. Patient tolerated well.  Performed by: Mathew Pinal, RN  Follow up/ Additional notes: Follow up this afternoon at 4pm

## 2023-09-28 NOTE — Progress Notes (Signed)
 09/28/2023 1:04 PM   Lucas Robinson 1950/03/31 [id number]  CC: Chief Complaint  Patient presents with   Urinary Retention   HPI: Lucas Robinson is a 73 y.o. male with PMH BPH on Flomax  and finasteride  x 3 weeks, dementia, DVT in July on Eliquis , and elevated PSA with benign prostate MRIs in 2018 and 2024 previously managed by [facility] urology who presents today as a new patient for voiding trial.  He is accompanied today by his wife, who contributes to HPI.  He has failed 2 prior voiding trials, most recently with [facility] on 08/26/2023.  Foley catheter was removed at that time, and he ended up back in the emergency department later that day with urinary retention requiring Foley catheter replacement.  He was discharged 3 days later to [facility], but he is now back home.  They wish to transfer care to our practice since they live locally.  Foley catheter removed in the morning, see separate note.  He returned in the afternoon for PVR.  He has only been able to void very small amounts.  PVR .  His prostate volume at the time of MRI in February 2024 was 75.7 mL.  He reports good baseline erections.  No prior prostate biopsies.  He has a family history of BPH.  PMH: Past Medical History:  Diagnosis Date   Anal fissure    Back pain    Lumbar ESI (Dr. Carles)   Enlarged prostate    HLD (hyperlipidemia) 08/2001   Hx: UTI (urinary tract infection)    Prostatitis     Surgical History: Past Surgical History:  Procedure Laterality Date   CERVICAL FUSION  05/18/2002   C5/6; C6/7 (Kritzer)   LUMBAR FUSION     REPLACEMENT TOTAL KNEE Right 08/04/2023   VASECTOMY  01/18/1977   Dr. Nestor    Home Medications:  Allergies as of 09/28/2023       Reactions   Ciprofloxacin Other (See Comments), Swelling   REACTION: PALPITATIONS Other Reaction(s): Not available ciprofloxacin   Oxycodone  Anxiety, Other (See Comments)   Oxycodone  causes constipation and makes him sick to his  stomach. Says that he cannot take narcotics only non-narcs.    Misc. Sulfonamide Containing Compounds Other (See Comments)   Sulfa  Antibiotics Other (See Comments)        Medication List        Accurate as of September 28, 2023  1:04 PM. If you have any questions, ask your nurse or doctor.          apixaban  5 MG Tabs tablet Commonly known as: ELIQUIS  Take 1 tablet (5 mg total) by mouth 2 (two) times daily. Take two tablets (10mg ) by mouth 2 (two) times daily for the first 7 days, then take one tablet (5mg ) by mouth 2 (two) times daily.   cefpodoxime  200 MG tablet Commonly known as: VANTIN  Take 1 tablet (200 mg total) by mouth 2 (two) times daily for 10 days.   finasteride  5 MG tablet Commonly known as: PROSCAR  Take 5 mg by mouth daily.   ibuprofen 200 MG tablet Commonly known as: ADVIL Take 200 mg by mouth as needed.   memantine  10 MG tablet Commonly known as: NAMENDA  Take 10 mg by mouth daily.   prednisoLONE acetate 1 % ophthalmic suspension Commonly known as: PRED FORTE Place 1 drop into the left eye 4 (four) times daily.   tamsulosin  0.4 MG Caps capsule Commonly known as: FLOMAX  Take 0.4 mg by mouth daily after  supper.        Allergies:  Allergies  Allergen Reactions   Ciprofloxacin Other (See Comments) and Swelling    REACTION: PALPITATIONS  Other Reaction(s): Not available  ciprofloxacin   Oxycodone  Anxiety and Other (See Comments)    Oxycodone  causes constipation and makes him sick to his stomach. Says that he cannot take narcotics only non-narcs.    Misc. Sulfonamide Containing Compounds Other (See Comments)   Sulfa  Antibiotics Other (See Comments)    Family History: Family History  Problem Relation Age of Onset   Transient ischemic attack Father    Stroke Father    Arthritis Sister    Coronary artery disease Other        GF   Hypertension Other    Colon cancer Other    Diabetes Other    Ovarian cancer Maternal Grandmother     Parkinsonism Mother    Arthritis Sister    Coronary artery disease Other    Colon cancer Other    Prostate cancer Neg Hx     Social History:   reports that he has never smoked. He has never used smokeless tobacco. He reports current alcohol use. He reports that he does not use drugs.  Physical Exam: There were no vitals taken for this visit.  Constitutional:  Alert and oriented, no acute distress, nontoxic appearing HEENT: , AT Cardiovascular: No clubbing, cyanosis, or edema Respiratory: Normal respiratory effort, no increased work of breathing Skin: No rashes, bruises or suspicious lesions Neurologic: Grossly intact, no focal deficits, moving all 4 extremities Psychiatric: Normal mood and affect  Laboratory Data: Results for orders placed or performed in visit on 09/28/23  BLADDER SCAN AMB NON-IMAGING   Collection Time: 09/28/23  4:24 PM  Result Value Ref Range   Scan Result 500   Bladder Scan (Post Void Residual) in office   Collection Time: 09/28/23  4:31 PM  Result Value Ref Range   Scan Result 498    Simple Catheter Placement  Due to urinary retention patient is present today for a foley cath placement.  Patient was cleaned and prepped in a sterile fashion with betadine and 2% lidocaine  jelly was instilled into the urethra. A 16 FR coude foley catheter was inserted, urine return was noted  , urine was yellow in color.  The balloon was filled with 10cc of sterile water.  A leg bag was attached for drainage. Patient was also given a night bag to take home.  Patient tolerated well, no complications were noted   Performed by: Danija Gosa, PA-C   Assessment & Plan:   1. Benign prostatic hyperplasia with urinary obstruction (Primary) He failed his voiding trial today, now his third failed voiding trial.  He is open to consideration of surgery, though I explained to him that he will need to be able to get cardiac clearance to hold Eliquis  in order to do this, and  I anticipate he will be on Eliquis  for a total of 3 to 6 months.  I replaced his Foley as above and we will schedule him for cystoscopy with Dr. Francisca to discuss HOLEP, which we can do once cardiac clearance is obtained.  He is in agreement with this plan.  Continue Flomax  and finasteride  in the interim. - BLADDER SCAN AMB NON-IMAGING  2. Elevated PSA 2 prior prostate MRIs without significant lesions identified.  No prior biopsies.  We discussed that if he has HOLEP we will be able to send tissue for pathology.  Will continue  to monitor.  Return in about 4 weeks (around 10/26/2023) for Cysto with Dr. Francisca.  Lucie Hones, PA-C  [facility] [address], [address] [place], [place] [address] [phone]

## 2023-09-29 ENCOUNTER — Other Ambulatory Visit: Admitting: Urology

## 2023-09-29 NOTE — Progress Notes (Signed)
 ED Antimicrobial Stewardship Positive Culture Follow Up   Lucas Robinson is an 73 y.o. male who presented to [facility] on 09/25/2023 with a chief complaint of  Chief Complaint  Patient presents with   Laceration    Recent Results (from the past 720 hours)  Urine Culture     Status: Abnormal   Collection Time: 09/25/23  9:46 PM   Specimen: Urine, Catheterized  Result Value Ref Range Status   Specimen Description   Final    URINE, CATHETERIZED Performed at [facility], [address], [place], [place] [address]    Special Requests   Final    NONE Performed at [facility], [address], [place], [place] [address]    Culture (A)  Final    >=100,000 COLONIES/mL PSEUDOMONAS AERUGINOSA >=100,000 COLONIES/mL KLEBSIELLA OXYTOCA    Report Status 09/28/2023 FINAL  Final   Organism ID, Bacteria PSEUDOMONAS AERUGINOSA (A)  Final   Organism ID, Bacteria KLEBSIELLA OXYTOCA (A)  Final      Susceptibility   Klebsiella oxytoca - MIC*    AMPICILLIN RESISTANT Resistant     CEFEPIME <=0.12 SENSITIVE Sensitive     ERTAPENEM <=0.12 SENSITIVE Sensitive     CEFTRIAXONE  <=0.25 SENSITIVE Sensitive     CIPROFLOXACIN <=0.06 SENSITIVE Sensitive     GENTAMICIN <=1 SENSITIVE Sensitive     NITROFURANTOIN 32 SENSITIVE Sensitive     TRIMETH /SULFA  <=20 SENSITIVE Sensitive     AMPICILLIN/SULBACTAM 4 SENSITIVE Sensitive     PIP/TAZO Value in next row Sensitive ug/mL     <=4 SENSITIVEThis is a modified FDA-approved test that has been validated and its performance characteristics determined by the reporting laboratory.  This laboratory is certified under the Clinical Laboratory Improvement Amendments CLIA as qualified to perform high complexity clinical laboratory testing.    MEROPENEM Value in next row Sensitive      <=4 SENSITIVEThis is a modified FDA-approved test that has been validated and its performance characteristics determined by the reporting laboratory.  This laboratory is  certified under the Clinical Laboratory Improvement Amendments CLIA as qualified to perform high complexity clinical laboratory testing.    * >=100,000 COLONIES/mL KLEBSIELLA OXYTOCA   Pseudomonas aeruginosa - MIC*    MEROPENEM Value in next row Sensitive      <=4 SENSITIVEThis is a modified FDA-approved test that has been validated and its performance characteristics determined by the reporting laboratory.  This laboratory is certified under the Clinical Laboratory Improvement Amendments CLIA as qualified to perform high complexity clinical laboratory testing.    CIPROFLOXACIN Value in next row Sensitive      <=4 SENSITIVEThis is a modified FDA-approved test that has been validated and its performance characteristics determined by the reporting laboratory.  This laboratory is certified under the Clinical Laboratory Improvement Amendments CLIA as qualified to perform high complexity clinical laboratory testing.    IMIPENEM Value in next row Sensitive      <=4 SENSITIVEThis is a modified FDA-approved test that has been validated and its performance characteristics determined by the reporting laboratory.  This laboratory is certified under the Clinical Laboratory Improvement Amendments CLIA as qualified to perform high complexity clinical laboratory testing.    PIP/TAZO Value in next row Sensitive ug/mL     8 SENSITIVEThis is a modified FDA-approved test that has been validated and its performance characteristics determined by the reporting laboratory.  This laboratory is certified under the Clinical Laboratory Improvement Amendments CLIA as qualified to perform high complexity clinical laboratory testing.  CEFEPIME Value in next row Sensitive      8 SENSITIVEThis is a modified FDA-approved test that has been validated and its performance characteristics determined by the reporting laboratory.  This laboratory is certified under the Clinical Laboratory Improvement Amendments CLIA as qualified to perform  high complexity clinical laboratory testing.    CEFTAZIDIME/AVIBACTAM Value in next row Sensitive ug/mL     8 SENSITIVEThis is a modified FDA-approved test that has been validated and its performance characteristics determined by the reporting laboratory.  This laboratory is certified under the Clinical Laboratory Improvement Amendments CLIA as qualified to perform high complexity clinical laboratory testing.    CEFTOLOZANE/TAZOBACTAM Value in next row Sensitive ug/mL     8 SENSITIVEThis is a modified FDA-approved test that has been validated and its performance characteristics determined by the reporting laboratory.  This laboratory is certified under the Clinical Laboratory Improvement Amendments CLIA as qualified to perform high complexity clinical laboratory testing.    TOBRAMYCIN Value in next row Sensitive      8 SENSITIVEThis is a modified FDA-approved test that has been validated and its performance characteristics determined by the reporting laboratory.  This laboratory is certified under the Clinical Laboratory Improvement Amendments CLIA as qualified to perform high complexity clinical laboratory testing.    CEFTAZIDIME Value in next row Sensitive      8 SENSITIVEThis is a modified FDA-approved test that has been validated and its performance characteristics determined by the reporting laboratory.  This laboratory is certified under the Clinical Laboratory Improvement Amendments CLIA as qualified to perform high complexity clinical laboratory testing.    * >=100,000 COLONIES/mL PSEUDOMONAS AERUGINOSA    [x]  Treated with cefpodoxime , organism resistant to prescribed antimicrobial  Called patient on 09/29/23 who confirmed no signs/symptoms of UTI and denied any fevers in the last few days. Patient saw urology on 09/28/23 for voiding trial and foley catheter exchange - per note, urologist did not have suspicion for possible infection.   Spoke with EDP and agreed to instruct patient to stop  taking antibiotic and follow-up with urology as normal.  ED Provider: Ronal Lewandowsky, MD  Thank you for involving pharmacy in this patient's care.   Damien Napoleon, PharmD Clinical Pharmacist 09/29/2023 2:13 PM

## 2023-10-04 ENCOUNTER — Other Ambulatory Visit: Admitting: Urology

## 2023-10-11 ENCOUNTER — Other Ambulatory Visit: Payer: Self-pay

## 2023-10-11 ENCOUNTER — Ambulatory Visit (INDEPENDENT_AMBULATORY_CARE_PROVIDER_SITE_OTHER): Admitting: Urology

## 2023-10-11 ENCOUNTER — Encounter: Payer: Self-pay | Admitting: Urology

## 2023-10-11 VITALS — BP 115/75 | HR 112

## 2023-10-11 DIAGNOSIS — N138 Other obstructive and reflux uropathy: Secondary | ICD-10-CM

## 2023-10-11 DIAGNOSIS — N401 Enlarged prostate with lower urinary tract symptoms: Secondary | ICD-10-CM | POA: Diagnosis not present

## 2023-10-11 MED ORDER — LIDOCAINE HCL URETHRAL/MUCOSAL 2 % EX GEL
1.0000 | Freq: Once | CUTANEOUS | Status: AC
  Administered 2023-10-11: 1 via URETHRAL

## 2023-10-11 NOTE — Progress Notes (Signed)
 Cystoscopy Procedure Note:  Indication: BPH with obstruction, retention  Previously followed by [facility] urology, failed voiding trial with Sheppard Hones, PA 09/28/2023(failed 3 voiding trials total) History of elevated PSA with negative workup, including benign prostate MRI February 2024 with 75 g prostate  After informed consent and discussion of the procedure and its risks, Lucas Robinson was positioned and prepped in the standard fashion. Cystoscopy was performed with a flexible cystoscope. The urethra, bladder neck and entire bladder was visualized in a standard fashion. The prostate was large with obstructing lateral lobes and bladder neck elevation. The ureteral orifices were visualized in their normal location and orientation.  Vision limited by debris in the bladder but no suspicious lesions, large prostate on retroflexion.  Foley replaced, see nursing note.  Findings: Enlarged prostate  ------------------------------------------------------------------------------  Assessment and Plan: 73 year old male with BPH and Foley dependent urinary retention despite maximal medical therapy, failed multiple voiding trials, here today to discuss outlet procedures.  Prostate measured at 75 g on recent prostate MRI that was otherwise benign.  He also has a history of recent DVT diagnosed 08/12/2023 and remains on Eliquis .  We discussed treatment options including prostate artery embolization or HOLEP, with HOLEP being a more definitive and durable long-term treatment option.  We discussed the risks and benefits of HoLEP at length.  The procedure requires general anesthesia and takes 1 to 2 hours, and a holmium laser is used to enucleate the prostate and push this tissue into the bladder.  A morcellator is then used to remove this tissue, which is sent for pathology.  The vast majority(>95%) of patients are able to discharge the same day with a catheter in place for 2 to 3 days, and will follow-up in  clinic for a voiding trial.  We specifically discussed the risks of bleeding, infection, retrograde ejaculation, temporary urgency and urge incontinence, very low risk of long-term incontinence, urethral stricture/bladder neck contracture, pathologic evaluation of prostate tissue and possible detection of prostate cancer or other malignancy, and possible need for additional procedures.  Schedule HOLEP (75g) Can stop Flomax , continue finasteride  Will schedule 1 month Foley change, send pre-op UA/culture at that visit Needs clearance to hold Eliquis  in the setting of recent DVT July 2025. Likely need to wait at least 3 months(end of October or later)  I spent 45 total minutes on the day of the encounter including pre-visit review of the medical record, face-to-face time with the patient, and post visit ordering of labs/imaging/tests.  Extensive review of outside records, and long conversation with patient and family about treatment options for Foley dependent urinary retention, and need to wait 3 months in the setting of recent DVT July 2025.   Lucas Burnet, MD 10/11/2023

## 2023-10-11 NOTE — Patient Instructions (Signed)

## 2023-10-11 NOTE — Progress Notes (Unsigned)
 Surgical Physician Order [facility] [place]  * Scheduling expectation : Early November 2025  *Length of Case: 1.5 hours  *Clearance needed: yes, PCP to hold Eliquis  after DVT in July 2025  *Anticoagulation Instructions: Hold all anticoagulants  *Aspirin Instructions: 81mg  aspirin okay  *Post-op visit Date/Instructions:  1-3 day cath removal  *Diagnosis: BPH w/urinary obstruction  *Procedure:  HOLEP (47350)   Additional orders: N/A  -Admit type: OUTpatient  -Anesthesia: General  -VTE Prophylaxis Standing Order SCD's       Other:   -Standing Lab Orders Per Anesthesia    Lab other: UA&Urine Culture  -Standing Test orders EKG/Chest x-ray per Anesthesia       Test other:   - Medications: Cefepime 2g IV  -Other orders:  N/A

## 2023-10-11 NOTE — Progress Notes (Signed)
 Cath Change/ Replacement  Patient is present today for a catheter change due to urinary retention. 9ml of water was removed from the balloon, a 16FR coude foley cath was removed without difficulty.  Patient was cleaned and prepped in a sterile fashion with betadine and 2% lidocaine  jelly was instilled into the urethra. A 16FR Coude foley cath was replaced into the bladder, no complications were noted. Urine return was noted and urine was yellow in color. The balloon was filled with 10ml of sterile water. A leg bag was attached for drainage.  A night bag was also given to the patient and patient was given instruction on how to change from one bag to another. Patient was given proper instruction on catheter care.    Performed by: Santa Gang, CMA, (AAMA)   Follow up: RTC as scheduled.

## 2023-10-14 ENCOUNTER — Other Ambulatory Visit: Payer: Self-pay

## 2023-10-14 ENCOUNTER — Telehealth: Payer: Self-pay

## 2023-10-14 DIAGNOSIS — N138 Other obstructive and reflux uropathy: Secondary | ICD-10-CM

## 2023-10-14 DIAGNOSIS — I82461 Acute embolism and thrombosis of right calf muscular vein: Secondary | ICD-10-CM

## 2023-10-14 NOTE — Progress Notes (Signed)
  Phone Number: [phone] for Surgical Coordinator Fax Number: [phone]  REQUEST FOR SURGICAL CLEARANCE      Date: 10/14/2023  Faxed to: Dr. Glover, MD  Surgeon: Dr. Redell Burnet, MD     Date of Surgery: 12/05/2023  Operation: Holmium Laser Enucleation of the Prostate   Anesthesia Type: General   Diagnosis: Benign Prostatic Hyperplasia with Urinary Obstruction  Patient Requires:   Medical Clearance : Yes  Reason: Patient will need to hold Eliquis  prior to procedure, may take 81mg  ASA if deemed necessary.   Risk Assessment:    Low   []       Moderate   []     High   []           This patient is optimized for surgery  YES []       NO   []    I recommend further assessment/workup prior to surgery. YES []      NO  []   Appointment scheduled for: _______________________   Further recommendations: ____________________________________     Physician Signature:__________________________________   Printed Name: ________________________________________   Date: _________________

## 2023-10-14 NOTE — Progress Notes (Signed)
   [facility] Surgical Posting Form  Surgery Date: Date: 12/05/2023  Surgeon: Dr. Redell Burnet, MD  Inpt ( No  )   Outpt (Yes)   Obs ( No  )   Diagnosis: N40.1, N13.8 Benign Prostatic Hyperplasia with Urinary Obstruction  -CPT: 47350  Surgery: Holmium Laser Enucleation of the Prostate  Stop Anticoagulations: Yes, Will need to hold Eliquis , should be off by surgery date  Cardiac/Medical/Pulmonary Clearance needed: Yes  Clearance needed from Dr: GloverGLENWOOD Jarvis of whom is monitioring eliquis  per patient  Clearance request sent on: Date: 10/14/23  *Orders entered into EPIC  Date: 10/14/23   *Case booked in EPIC  Date: 10/14/23  *Notified pt of Surgery: Date: 10/14/23  PRE-OP UA & CX: yes, will obtain in clinic on 11/14/2023  *Placed into Prior Authorization Work [place] Date: 10/14/23  Assistant/laser/rep:No

## 2023-10-14 NOTE — Telephone Encounter (Signed)
 Per Dr. Francisca, Patient is to be scheduled for Holmium Laser Enucleation of the Prostate   Lucas Robinson was contacted and possible surgical dates were discussed, Monday November 17th, 2025 was agreed upon for surgery.   Patient was instructed that Dr. Francisca will require them to provide a pre-op UA & CX prior to surgery. This was ordered and scheduled drop off appointment was made for 11/14/2023.    Patient was directed to call [phone] between 1-3pm the day before surgery to find out surgical arrival time.  Instructions were given not to eat or drink from midnight on the night before surgery and have a driver for the day of surgery. On the surgery day patient was instructed to enter through the [facility] entrance of [facility] report the Same Day Surgery desk.   Pre-Admit Testing will be in contact via phone to set up an interview with the anesthesia team to review your history and medications prior to surgery.   Reminder of this information was sent via MyChart to the patient.

## 2023-10-19 ENCOUNTER — Encounter (INDEPENDENT_AMBULATORY_CARE_PROVIDER_SITE_OTHER): Payer: Self-pay | Admitting: Vascular Surgery

## 2023-10-19 ENCOUNTER — Ambulatory Visit (INDEPENDENT_AMBULATORY_CARE_PROVIDER_SITE_OTHER): Payer: Self-pay | Admitting: Vascular Surgery

## 2023-10-19 VITALS — BP 131/87 | HR 63 | Ht 71.0 in | Wt 181.6 lb

## 2023-10-19 DIAGNOSIS — N138 Other obstructive and reflux uropathy: Secondary | ICD-10-CM

## 2023-10-19 DIAGNOSIS — N401 Enlarged prostate with lower urinary tract symptoms: Secondary | ICD-10-CM | POA: Diagnosis not present

## 2023-10-19 DIAGNOSIS — I82461 Acute embolism and thrombosis of right calf muscular vein: Secondary | ICD-10-CM

## 2023-10-19 NOTE — Progress Notes (Signed)
 Subjective:    Patient ID: Lucas Robinson, male    DOB: 1950/12/21, 73 y.o.   MRN: [id number] Chief Complaint  Patient presents with   New Patient (Initial Visit)    DVT - pt currently on Eliquis , never followed up from Hospital Admission, has a pending urology surgery )Redell Fabry)    Lucas Robinson is a 73 yo male who presents to clinic today in follow up for DVT to left lower extremity post left knee replacement back in July of 2025.  Patient postoperatively developed urinary retention due to benign prostatic hyperplasia.  He had a Foley catheter placed after he had knee replacements with a DVT to his left lower extremity.  Patient's wife is at his side and they both endorse the patient needs a TURP and this is scheduled for December 05, 2023.  Patient will need to be off any anticoagulation for this surgery.  Patient presents today to discuss this option as well as follow-up for the DVT as scheduled.  Patient has no lower extremity complaints today.  The right lower extremity is not swollen and does not have any open sores.  Patient endorses he ambulates 2 miles a day and has been compliant with Eliquis  which he was placed on after developing the DVT.    Review of Systems  Constitutional: Negative.   Musculoskeletal:  Positive for joint swelling and myalgias.       History of right total knee arthroplasty from August 02, 2023.  Postoperative myalgias due to peroneal and tibial DVTs.  All other systems reviewed and are negative.      Objective:   Physical Exam Constitutional:      Appearance: Normal appearance. He is normal weight.  HENT:     Head: Normocephalic.  Eyes:     Pupils: Pupils are equal, round, and reactive to light.  Cardiovascular:     Rate and Rhythm: Normal rate and regular rhythm.     Pulses: Normal pulses.     Heart sounds: Normal heart sounds.  Pulmonary:     Effort: Pulmonary effort is normal.     Breath sounds: Normal breath sounds.  Abdominal:      General: Abdomen is flat. Bowel sounds are normal.     Palpations: Abdomen is soft.  Musculoskeletal:        General: Normal range of motion.     Comments: Postoperative from right total knee arthroplasty from August 02, 2023.  Noted scar to his right knee.  Skin:    General: Skin is warm and dry.     Capillary Refill: Capillary refill takes 2 to 3 seconds.  Neurological:     General: No focal deficit present.     Mental Status: He is alert and oriented to person, place, and time. Mental status is at baseline.  Psychiatric:        Mood and Affect: Mood normal.        Behavior: Behavior normal.        Thought Content: Thought content normal.        Judgment: Judgment normal.     There were no vitals taken for this visit.  Past Medical History:  Diagnosis Date   Anal fissure    Back pain    Lumbar ESI (Dr. Carles)   Enlarged prostate    HLD (hyperlipidemia) 08/2001   Hx: UTI (urinary tract infection)    Prostatitis     Social History   Socioeconomic History   Marital status: Married  Spouse name: Not on file   Number of children: 3   Years of education: Not on file   Highest education level: Not on file  Occupational History   Occupation:    Tobacco Use   Smoking status: Never   Smokeless tobacco: Never  Vaping Use   Vaping status: Never Used  Substance and Sexual Activity   Alcohol use: Yes    Comment: beer occasionally   Drug use: No   Sexual activity: Not Currently  Other Topics Concern   Not on file  Social History Narrative   Married/remarried-wife is a Education officer, environmental, worked at [employer] in [employer] in [place] prev   3 boys   [employer] secretary   Social Drivers of Health   Financial Resource Strain: Low Risk  (12/20/2022)   Received from [facility]   Overall Financial Resource Strain (CARDIA)    Difficulty of Paying Living Expenses: Not very hard  Food Insecurity: No Food Insecurity (08/26/2023)   Hunger Vital Sign     Worried About Running Out of Food in the Last Year: Never true    Ran Out of Food in the Last Year: Never true  Transportation Needs: No Transportation Needs (08/26/2023)   PRAPARE - Administrator, Civil Service (Medical): No    Lack of Transportation (Non-Medical): No  Physical Activity: Not on file  Stress: Not on file  Social Connections: Socially Integrated (08/26/2023)   Social Connection and Isolation Panel    Frequency of Communication with Friends and Family: More than three times a week    Frequency of Social Gatherings with Friends and Family: Twice a week    Attends Religious Services: 1 to 4 times per year    Active Member of [employer] or Organizations: Yes    Attends Banker Meetings: Never    Marital Status: Married  Catering manager Violence: Not At Risk (08/26/2023)   Humiliation, Afraid, Rape, and Kick questionnaire    Fear of Current or Ex-Partner: No    Emotionally Abused: No    Physically Abused: No    Sexually Abused: No    Past Surgical History:  Procedure Laterality Date   CERVICAL FUSION  05/18/2002   C5/6; C6/7 (Kritzer)   LUMBAR FUSION     REPLACEMENT TOTAL KNEE Right 08/04/2023   VASECTOMY  01/18/1977   Dr. Nestor    Family History  Problem Relation Age of Onset   Transient ischemic attack Father    Stroke Father    Arthritis Sister    Coronary artery disease Other        GF   Hypertension Other    Colon cancer Other    Diabetes Other    Ovarian cancer Maternal Grandmother    Parkinsonism Mother    Arthritis Sister    Coronary artery disease Other    Colon cancer Other    Prostate cancer Neg Hx     Allergies  Allergen Reactions   Ciprofloxacin Other (See Comments) and Swelling    REACTION: PALPITATIONS  Other Reaction(s): Not available  ciprofloxacin   Oxycodone  Anxiety and Other (See Comments)    Oxycodone  causes constipation and makes him sick to his stomach. Says that he cannot take narcotics only  non-narcs.    Misc. Sulfonamide Containing Compounds Other (See Comments)   Sulfa  Antibiotics Other (See Comments)       Latest Ref Rng & Units 08/27/2023    6:11 AM 08/26/2023  6:14 PM 08/13/2023    1:24 PM  CBC  WBC 4.0 - 10.5 K/uL 7.4  12.3  8.7   Hemoglobin 13.0 - 17.0 g/dL 87.7  85.9  85.9   Hematocrit 39.0 - 52.0 % 36.7  40.7  43.8   Platelets 150 - 400 K/uL 345  450  344       CMP     Component Value Date/Time   NA 139 08/27/2023 0611   K 3.9 08/27/2023 0611   CL 104 08/27/2023 0611   CO2 25 08/27/2023 0611   GLUCOSE 97 08/27/2023 0611   BUN 11 08/27/2023 0611   CREATININE 0.73 08/27/2023 0611   CALCIUM 9.0 08/27/2023 0611   PROT 7.1 03/13/2012 0851   ALBUMIN 4.2 03/13/2012 0851   AST 23 03/13/2012 0851   ALT 16 03/13/2012 0851   ALKPHOS 51 03/13/2012 0851   BILITOT 1.0 03/13/2012 0851   GFR 75.06 03/13/2012 0851   GFRNONAA >60 08/27/2023 0611     No results found.     Assessment & Plan:   1. Acute deep vein thrombosis (DVT) of calf muscle vein of right lower extremity (HCC) (Primary) Patient presents to clinic for postoperative follow-up for DVT post right total knee arthroplasty.  Patient presents to clinic today without any complaints of right lower extremity pain or swelling.  Patient endorses has been walking 2 miles a day and has not had any difficulties with his leg post his right knee arthroplasty and DVT.  He endorses he feels fine.  He and his wife follow-up today to discuss the need for further anticoagulation as he is scheduled for a TURP in November 2025.  He wishes to know if he can stop the anticoagulation today and be safe or should he stay on his current dose of Eliquis  and defer his upcoming surgery.  Vascular surgery always recommends patient's be on anticoagulation, such as Eliquis  which he is on, for at least 6 months post diagnosis of a DVT.  However patients have undergone certain procedures without any extension or complications of a DVT  after being on anticoagulation such as Eliquis  for 90 days.  Patient is scheduled for surgery on November 17 which would put him 10 to 20 days post being on anticoagulation for 90 days.  I believe he would be okay to stop the Eliquis  5 days prior to surgery and be placed back on Eliquis  5 days after surgery if needed.  Both the patient and his wife are very concerned about this and do not want to have any serious complications related to an extension of a DVT that could cause pulmonary embolism, heart attack or even death.  I will plan on bringing the patient back on November 5 for follow-up which will include a venous Doppler study of his right lower extremity to evaluate DVT.  If the patient's lower DVT has resolved after being on Eliquis  for 90+ days the patient can stop his Eliquis  and have surgery as scheduled.  We would then follow-up in another 2 to 3 months with another repeat ultrasound of to determine if he has developed another DVT or remains clear.  If the venous ultrasound is positive for DVT on November 5 we will have another discussion regarding whether they would like to stop the Eliquis  for the surgery or delay the surgery until he has been on Eliquis  for at least 6 months.   2. Benign prostatic hyperplasia with urinary obstruction Continue BPH medications as already ordered, these medications  have been reviewed and there are no changes at this time.  Patient scheduled for TURP on December 05, 2023.   Current Outpatient Medications on File Prior to Visit  Medication Sig Dispense Refill   apixaban  (ELIQUIS ) 5 MG TABS tablet Take 1 tablet (5 mg total) by mouth 2 (two) times daily. Take two tablets (10mg ) by mouth 2 (two) times daily for the first 7 days, then take one tablet (5mg ) by mouth 2 (two) times daily. 74 tablet 0   cephALEXin (KEFLEX) 500 MG capsule Take 500 mg by mouth 2 (two) times daily. for 7 days     finasteride  (PROSCAR ) 5 MG tablet Take 5 mg by mouth daily.     memantine   (NAMENDA ) 10 MG tablet Take 10 mg by mouth daily.     traMADol  (ULTRAM ) 50 MG tablet Take 50 mg by mouth every 6 (six) hours as needed.     No current facility-administered medications on file prior to visit.    There are no Patient Instructions on file for this visit. No follow-ups on file.   Lucas JONELLE Shank, NP

## 2023-10-31 ENCOUNTER — Ambulatory Visit: Admitting: Physician Assistant

## 2023-11-02 ENCOUNTER — Ambulatory Visit (INDEPENDENT_AMBULATORY_CARE_PROVIDER_SITE_OTHER): Admitting: Urology

## 2023-11-02 ENCOUNTER — Telehealth: Payer: Self-pay | Admitting: Urology

## 2023-11-02 ENCOUNTER — Encounter: Payer: Self-pay | Admitting: Urology

## 2023-11-02 VITALS — BP 135/86 | HR 69 | Ht 70.0 in | Wt 184.0 lb

## 2023-11-02 DIAGNOSIS — R310 Gross hematuria: Secondary | ICD-10-CM

## 2023-11-02 DIAGNOSIS — N401 Enlarged prostate with lower urinary tract symptoms: Secondary | ICD-10-CM | POA: Diagnosis not present

## 2023-11-02 DIAGNOSIS — N138 Other obstructive and reflux uropathy: Secondary | ICD-10-CM

## 2023-11-02 LAB — URINALYSIS, COMPLETE
Bilirubin, UA: NEGATIVE
Glucose, UA: NEGATIVE
Ketones, UA: NEGATIVE
Leukocytes,UA: NEGATIVE
Nitrite, UA: NEGATIVE
Protein,UA: NEGATIVE
Specific Gravity, UA: <1.005 — ABNORMAL LOW (ref 1.005–1.030)
Urobilinogen, Ur: 0.2 mg/dL (ref 0.2–1.0)
pH, UA: 7 (ref 5.0–7.5)

## 2023-11-02 LAB — MICROSCOPIC EXAMINATION

## 2023-11-02 NOTE — Progress Notes (Signed)
 11/02/2023 2:11 PM   Lucas Robinson 1950-04-11 [id number]  Referring provider: Glover Lenis, MD [phone] [address] [place],  [place] [address]  Urological history: 1. BPH with retention - Managed with indwelling Foley - Scheduled for HoLEP on 12/05/2023 w/ Dr. Francisca   2. Elevated PSA - PSA (2024) 6.8  Chief Complaint  Patient presents with   Gross Hematuria   HPI: Lucas Robinson is a 73 y.o. man who presents today for catheter not draining and gross hematuria with his son, Redell.   Previous records reviewed.  He states that last evening he noticed that his leg bag had reduced output and the urine was the color of cranberry juice.  Patient denies any modifying or aggravating factors.  Patient denies any recent UTI's, gross hematuria, dysuria or suprapubic/flank pain.  Patient denies any fevers, chills, nausea or vomiting.    He has been having lower back pain, but that may be MSK.    UA yellow clear counts with a regular 7.005, pH 7.0, 3+ heme, 6-10 WBCs, 3-10 RBCs, 0-2 epithelial cells and few bacteria  PMH: Past Medical History:  Diagnosis Date   Anal fissure    Back pain    Lumbar ESI (Dr. Carles)   Enlarged prostate    HLD (hyperlipidemia) 08/2001   Hx: UTI (urinary tract infection)    Prostatitis     Surgical History: Past Surgical History:  Procedure Laterality Date   CERVICAL FUSION  05/18/2002   C5/6; C6/7 (Kritzer)   LUMBAR FUSION     REPLACEMENT TOTAL KNEE Right 08/04/2023   VASECTOMY  01/18/1977   Dr. Nestor    Home Medications:  Allergies as of 11/02/2023       Reactions   Ciprofloxacin Other (See Comments), Swelling   REACTION: PALPITATIONS Other Reaction(s): Not available ciprofloxacin   Oxycodone  Anxiety, Other (See Comments)   Oxycodone  causes constipation and makes him sick to his stomach. Says that he cannot take narcotics only non-narcs.    Misc. Sulfonamide Containing Compounds Other (See Comments)   Sulfa   Antibiotics Other (See Comments)        Medication List        Accurate as of November 02, 2023  2:11 PM. If you have any questions, ask your nurse or doctor.          apixaban  5 MG Tabs tablet Commonly known as: ELIQUIS  Take 1 tablet (5 mg total) by mouth 2 (two) times daily. Take two tablets (10mg ) by mouth 2 (two) times daily for the first 7 days, then take one tablet (5mg ) by mouth 2 (two) times daily.   finasteride  5 MG tablet Commonly known as: PROSCAR  Take 5 mg by mouth daily.   memantine  10 MG tablet Commonly known as: NAMENDA  Take 10 mg by mouth daily.   traMADol  50 MG tablet Commonly known as: ULTRAM  Take 50 mg by mouth every 6 (six) hours as needed.        Allergies:  Allergies  Allergen Reactions   Ciprofloxacin Other (See Comments) and Swelling    REACTION: PALPITATIONS  Other Reaction(s): Not available  ciprofloxacin   Oxycodone  Anxiety and Other (See Comments)    Oxycodone  causes constipation and makes him sick to his stomach. Says that he cannot take narcotics only non-narcs.    Misc. Sulfonamide Containing Compounds Other (See Comments)   Sulfa  Antibiotics Other (See Comments)    Family History: Family History  Problem Relation Age of Onset   Transient ischemic attack  Father    Stroke Father    Arthritis Sister    Coronary artery disease Other        GF   Hypertension Other    Colon cancer Other    Diabetes Other    Ovarian cancer Maternal Grandmother    Parkinsonism Mother    Arthritis Sister    Coronary artery disease Other    Colon cancer Other    Prostate cancer Neg Hx     Social History:  reports that he has never smoked. He has never used smokeless tobacco. He reports current alcohol use. He reports that he does not use drugs.  ROS: Pertinent ROS in HPI  Physical Exam: BP 135/86   Pulse 69   Ht 5' 10 (1.778 m)   Wt 184 lb (83.5 kg)   SpO2 93%   BMI 26.40 kg/m   Constitutional:  Well nourished. Alert and  oriented, No acute distress. HEENT: [place] AT, moist mucus membranes.  Trachea midline Cardiovascular: No clubbing, cyanosis, or edema. Respiratory: Normal respiratory effort, no increased work of breathing. GU: No CVA tenderness.  No bladder fullness or masses.  Patient with circumcised phallus.  Urethral meatus is patent.  No penile discharge. No penile lesions or rashes.  Neurologic: Grossly intact, no focal deficits, moving all 4 extremities. Psychiatric: Normal mood and affect.  Laboratory Data: See Epic and HPI   I have reviewed the labs.   Pertinent Imaging: N/A  Simple Catheter Placement Due to urinary retention patient is present today for a foley cath placement.  Patient was cleaned and prepped in a sterile fashion with betadine and 2% lidocaine  jelly was instilled into the urethra. A 18  FR Coude foley catheter was inserted, urine return was noted  200 ml, urine was yellow, clear in color.  The balloon was filled with 10cc of sterile water.  I irrigated with 60 cc of sterile water and the catheter irrigated easily.  There was no blood in the efflux.  A leg bag was attached for drainage. Patient was also given a night bag to take home and was given instruction on how to change from one bag to another.  Patient was given instruction on proper catheter care.  Patient tolerated well, no complications were noted   Performed by: CLOTILDA CORNWALL, PA-C and Laymon Ned, CMA    Assessment & Plan:    1. Gross hematuria - may have been secondary to a clot - currently resolved - UA w/ micro heme   2. BPH with retention - catheter exchanged today - next exchange on the 28th  Return for Keep follow up appointment .  These notes generated with voice recognition software. I apologize for typographical errors.  CLOTILDA CORNWALL RIGGERS  [facility] [address]  [address] [place], [place] [address] [phone]

## 2023-11-02 NOTE — Telephone Encounter (Signed)
 Please let Lucas Robinson know that his urine did not look bad.  We still have sent it for culture, but I am not going to send in an antibiotic at this time.  If he starts having any worsening of symptoms, please let us  know and I will send an antibiotic in at that time.  Otherwise, and we should have his culture results available next week.

## 2023-11-10 ENCOUNTER — Ambulatory Visit: Payer: Self-pay | Admitting: Urology

## 2023-11-10 LAB — CULTURE, URINE COMPREHENSIVE

## 2023-11-14 ENCOUNTER — Ambulatory Visit: Admitting: Urology

## 2023-11-14 NOTE — Progress Notes (Unsigned)
 Cath Change/ Replacement  Patient is present today for a catheter change due to urinary retention.  8 ml of water was removed from the balloon, a 18 FR Coude foley cath was removed without difficulty.  Patient was cleaned and prepped in a sterile fashion with betadine and 2% lidocaine  jelly was instilled into the urethra. A 18 FR Coude foley cath was replaced into the bladder, no complications were noted. Urine return was noted 70 ml and urine was yellow in color. The balloon was filled with 10ml of sterile water. A leg bag was attached for drainage.  A night bag was also given to the patient and patient was given instruction on how to change from one bag to another. Patient was given proper instruction on catheter care.    Performed by: CLOTILDA CORNWALL, PA-C   Follow up: Return for HoLEP .   UA yellow slightly cloudy, specific gravity greater than 1.030, pH 6.0, 1+ protein, 3+ blood, nitrate positive, trace ketone, 1+ leukocyte, greater than 30 WBCs, greater than 30 RBCs, 0-10 epithelial cells, granular casts present, mucus threads present and moderate bacteria  Urine sent for culture

## 2023-11-15 ENCOUNTER — Ambulatory Visit: Admitting: Urology

## 2023-11-15 VITALS — BP 145/88 | HR 93 | Ht 70.0 in | Wt 182.0 lb

## 2023-11-15 DIAGNOSIS — N138 Other obstructive and reflux uropathy: Secondary | ICD-10-CM

## 2023-11-15 DIAGNOSIS — N401 Enlarged prostate with lower urinary tract symptoms: Secondary | ICD-10-CM | POA: Diagnosis not present

## 2023-11-15 LAB — URINALYSIS, COMPLETE
Bilirubin, UA: NEGATIVE
Glucose, UA: NEGATIVE
Nitrite, UA: POSITIVE — AB
Specific Gravity, UA: 1.03 (ref 1.005–1.030)
Urobilinogen, Ur: 0.2 mg/dL (ref 0.2–1.0)
pH, UA: 6 (ref 5.0–7.5)

## 2023-11-15 LAB — MICROSCOPIC EXAMINATION
RBC, Urine: >30 /HPF — AB (ref 0–2)
WBC, UA: >30 /HPF — AB (ref 0–5)

## 2023-11-21 ENCOUNTER — Ambulatory Visit: Payer: Self-pay | Admitting: Urology

## 2023-11-21 LAB — CULTURE, URINE COMPREHENSIVE

## 2023-11-22 ENCOUNTER — Other Ambulatory Visit (INDEPENDENT_AMBULATORY_CARE_PROVIDER_SITE_OTHER): Payer: Self-pay | Admitting: Vascular Surgery

## 2023-11-22 DIAGNOSIS — I82461 Acute embolism and thrombosis of right calf muscular vein: Secondary | ICD-10-CM

## 2023-11-22 MED ORDER — FOSFOMYCIN TROMETHAMINE 3 G PO PACK: 3.0000 g | PACK | Freq: Once | ORAL | 0 refills | Status: AC

## 2023-11-22 NOTE — Telephone Encounter (Signed)
 Spoke with Pt's wife. Aware to take the Fosfomycin on 12/02/2023. Changed pre-op abx to cefepime 1g. Pt. Wife verbalized understanding. Also discussed parameters for holding Plavix prior to surgery- vascular advised 5 day hold. Last dosage of Plavix should be 11/29/23. Verbalized understanding.

## 2023-11-22 NOTE — Addendum Note (Signed)
 Addended by: RUTHER SETTER A on: 11/22/2023 10:34 AM   Modules accepted: Orders

## 2023-11-23 ENCOUNTER — Ambulatory Visit (INDEPENDENT_AMBULATORY_CARE_PROVIDER_SITE_OTHER): Admitting: Vascular Surgery

## 2023-11-23 ENCOUNTER — Other Ambulatory Visit (INDEPENDENT_AMBULATORY_CARE_PROVIDER_SITE_OTHER)

## 2023-11-23 VITALS — BP 119/78 | HR 90 | Resp 18 | Ht 71.0 in | Wt 187.0 lb

## 2023-11-23 DIAGNOSIS — N401 Enlarged prostate with lower urinary tract symptoms: Secondary | ICD-10-CM

## 2023-11-23 DIAGNOSIS — I82461 Acute embolism and thrombosis of right calf muscular vein: Secondary | ICD-10-CM

## 2023-11-23 DIAGNOSIS — N138 Other obstructive and reflux uropathy: Secondary | ICD-10-CM

## 2023-11-28 ENCOUNTER — Encounter
Admission: RE | Admit: 2023-11-28 | Discharge: 2023-11-28 | Disposition: A | Discharge status: Home / Self Care | Source: Ambulatory Visit | Attending: Urology | Admitting: Urology

## 2023-11-28 ENCOUNTER — Other Ambulatory Visit: Payer: Self-pay

## 2023-11-28 DIAGNOSIS — N138 Other obstructive and reflux uropathy: Secondary | ICD-10-CM

## 2023-11-28 DIAGNOSIS — Z01812 Encounter for preprocedural laboratory examination: Secondary | ICD-10-CM

## 2023-11-28 DIAGNOSIS — Z7901 Long term (current) use of anticoagulants: Secondary | ICD-10-CM

## 2023-11-28 HISTORY — DX: Other obstructive and reflux uropathy: N13.8

## 2023-11-28 HISTORY — DX: Personal history of urinary calculi: Z87.442

## 2023-11-28 HISTORY — DX: Headache, unspecified: R51.9

## 2023-11-28 HISTORY — DX: Unspecified osteoarthritis, unspecified site: M19.90

## 2023-11-28 HISTORY — DX: Gross hematuria: R31.0

## 2023-11-28 HISTORY — DX: Sleep apnea, unspecified: G47.30

## 2023-11-28 HISTORY — DX: Presence of other specified devices: Z97.8

## 2023-11-28 HISTORY — DX: Elevated prostate specific antigen (PSA): R97.20

## 2023-11-28 HISTORY — DX: Unspecified visual loss: H54.7

## 2023-11-28 HISTORY — DX: Dementia in other diseases classified elsewhere, unspecified severity, without behavioral disturbance, psychotic disturbance, mood disturbance, and anxiety: F02.80

## 2023-11-28 NOTE — Patient Instructions (Addendum)
 Your procedure is scheduled on: 12/05/23 - Monday Report to the Registration Desk on the 1st floor of the [facility]. To find out your arrival time, please call [phone] between 1PM - 3PM on: 12/02/23- Friday If your arrival time is 6:00 am, do not arrive before that time as the [facility] entrance doors do not open until 6:00 am.  REMEMBER: Instructions that are not followed completely may result in serious medical risk, up to and including death; or upon the discretion of your surgeon and anesthesiologist your surgery may need to be rescheduled.  Do not eat food or drink any liquids after midnight the night before surgery.  No gum chewing or hard candies.  One week prior to surgery: Stop Anti-inflammatories (NSAIDS) such as Advil, Aleve, Ibuprofen, Motrin, Naproxen, Naprosyn and Aspirin based products such as Excedrin, Goody's Powder, BC Powder.You may take Tylenol  if needed for pain up until the day of surgery.   Stop ANY OVER THE COUNTER supplements until after surgery : MAGNESIUM  GLYCINATE , vitamin D.  apixaban  (ELIQUIS ) hold 5 days before surgery, last dose 11/29/23. Resume with doctors instructions.  ON THE DAY OF SURGERY ONLY TAKE THESE MEDICATIONS WITH SIPS OF WATER:  alfuzosin (UROXATRAL)  memantine  (NAMENDA ) 3.   Povidone (IVIZIA )  4.   prednisoLONE acetate  5.  finasteride  (PROSCAR )   6.  azithromycin  (ZITHROMAX )    No Alcohol for 24 hours before or after surgery.  No Smoking including e-cigarettes for 24 hours before surgery.  No chewable tobacco products for at least 6 hours before surgery.  No nicotine patches on the day of surgery.  Do not use any recreational drugs for at least a week (preferably 2 weeks) before your surgery.  Please be advised that the combination of cocaine and anesthesia may have negative outcomes, up to and including death. If you test positive for cocaine, your surgery will be cancelled.  On the morning of surgery brush your  teeth with toothpaste and water, you may rinse your mouth with mouthwash if you wish. Do not swallow any toothpaste or mouthwash.  Do not wear jewelry, make-up, hairpins, clips or nail polish.  For welded (permanent) jewelry: bracelets, anklets, waist bands, etc.  Please have this removed prior to surgery.  If it is not removed, there is a chance that hospital personnel will need to cut it off on the day of surgery.  Do not wear lotions, powders, or perfumes.   Do not shave body hair from the neck down 48 hours before surgery.  Contact lenses, hearing aids and dentures may not be worn into surgery.  Do not bring valuables to the hospital. [facility] is not responsible for any missing/lost belongings or valuables.   Notify your doctor if there is any change in your medical condition (cold, fever, infection).  Wear comfortable clothing (specific to your surgery type) to the hospital.  After surgery, you can help prevent lung complications by doing breathing exercises.  Take deep breaths and cough every 1-2 hours. Your doctor may order a device called an Incentive Spirometer to help you take deep breaths.  If you are being admitted to the hospital overnight, leave your suitcase in the car. After surgery it may be brought to your room.  In case of increased patient census, it may be necessary for you, the patient, to continue your postoperative care in the Same Day Surgery department.  If you are being discharged the day of surgery, you will not be allowed  to drive home. You will need a responsible individual to drive you home and stay with you for 24 hours after surgery.   If you are taking public transportation, you will need to have a responsible individual with you.  Please call the Pre-admissions Testing Dept. at [phone] if you have any questions about these instructions.  Surgery Visitation Policy:  Patients having surgery or a procedure may have two visitors.  Children  under the age of 61 must have an adult with them who is not the patient.  Inpatient Visitation:    Visiting hours are 7 a.m. to 8 p.m. Up to four visitors are allowed at one time in a patient room. The visitors may rotate out with other people during the day.  One visitor age 64 or older may stay with the patient overnight and must be in the room by 8 p.m.   Merchandiser, Retail to address health-related social needs:  [email or web address]

## 2023-11-30 ENCOUNTER — Encounter (INDEPENDENT_AMBULATORY_CARE_PROVIDER_SITE_OTHER): Payer: Self-pay | Admitting: Vascular Surgery

## 2023-11-30 NOTE — Progress Notes (Signed)
 Subjective:    Patient ID: ROLLA KEDZIERSKI, male    DOB: 15-Dec-1950, 73 y.o.   MRN: [id number] Chief Complaint  Patient presents with   Follow-up    Venous right leg dvt u/s    Patient presents to clinic for postoperative follow-up for DVT post right total knee arthroplasty.  Patient presents to clinic today without any complaints of right lower extremity pain or swelling.  Patient endorses has been walking 2 miles a day and has not had any difficulties with his leg post his right knee arthroplasty and DVT.  He endorses he feels fine.  He and his wife follow-up today to discuss the need for further anticoagulation as he is scheduled for a TURP in November 2025.  He wishes to know if he can stop the anticoagulation today and be safe or should he stay on his current dose of Eliquis  and defer his upcoming surgery.    Review of Systems  Constitutional: Negative.   Genitourinary:  Positive for difficulty urinating.       No urology issues needing a TURP.  Patient is scheduled for surgery in a couple of weeks.  All other systems reviewed and are negative.      Objective:   Physical Exam Vitals reviewed.  Constitutional:      Appearance: Normal appearance. He is normal weight.  HENT:     Head: Normocephalic.  Eyes:     Pupils: Pupils are equal, round, and reactive to light.  Cardiovascular:     Rate and Rhythm: Normal rate and regular rhythm.     Pulses: Normal pulses.     Heart sounds: Normal heart sounds.  Pulmonary:     Effort: Pulmonary effort is normal.     Breath sounds: Normal breath sounds.  Abdominal:     General: Abdomen is flat. Bowel sounds are normal.     Palpations: Abdomen is soft.  Musculoskeletal:        General: Normal range of motion.  Skin:    General: Skin is warm and dry.     Capillary Refill: Capillary refill takes 2 to 3 seconds.  Neurological:     General: No focal deficit present.     Mental Status: He is alert and oriented to person, place, and  time. Mental status is at baseline.  Psychiatric:        Mood and Affect: Mood normal.        Behavior: Behavior normal.        Thought Content: Thought content normal.        Judgment: Judgment normal.     BP 119/78   Pulse 90   Resp 18   Ht 5' 11 (1.803 m)   Wt 187 lb (84.8 kg)   BMI 26.08 kg/m   Past Medical History:  Diagnosis Date   Alzheimer disease (HCC)    Anal fissure    Arthritis    Back pain    Lumbar ESI (Dr. 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)    Elevated PSA    Enlarged prostate    Gross hematuria    Headache    History of kidney stones    HLD (hyperlipidemia) 08/18/2001   Hx: UTI (urinary tract infection)    Indwelling Foley catheter present    18  FR Coude foley catheter   Prostatitis    Sleep apnea     Social History  Socioeconomic History   Marital status: Married    Spouse name: Buch,SHADRA S (Spouse)   Number of children: 3   Years of education: Not on file   Highest education level: Not on file  Occupational History   Occupation:    Tobacco Use   Smoking status: Never   Smokeless tobacco: Never  Vaping Use   Vaping status: Never Used  Substance and Sexual Activity   Alcohol use: Yes    Comment: beer occasionally   Drug use: No   Sexual activity: Not Currently  Other Topics Concern   Not on file  Social History Narrative   Married/remarried-wife is a education officer, environmental, worked at [employer] in [employer] in [place] prev   3 boys   [place]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   Overall Financial Resource Strain (CARDIA)    Difficulty of Paying Living Expenses: Not very hard  Food Insecurity: No Food Insecurity (08/26/2023)   Hunger Vital Sign    Worried About Running Out of Food in the Last Year: Never true    Ran Out of Food in the  Last Year: Never true  Transportation Needs: No Transportation Needs (08/26/2023)   PRAPARE - Administrator, Civil Service (Medical): No    Lack of Transportation (Non-Medical): No  Physical Activity: Not on file  Stress: Not on file  Social Connections: Socially Integrated (08/26/2023)   Social Connection and Isolation Panel    Frequency of Communication with Friends and Family: More than three times a week    Frequency of Social Gatherings with Friends and Family: Twice a week    Attends Religious Services: 1 to 4 times per year    Active Member of [employer] or Organizations: Yes    Attends Banker Meetings: Never    Marital Status: Married  Catering Manager Violence: Not At Risk (08/26/2023)   Humiliation, Afraid, Rape, and Kick questionnaire    Fear of Current or Ex-Partner: No    Emotionally Abused: No    Physically Abused: No    Sexually Abused: No    Past Surgical History:  Procedure Laterality Date   CERVICAL FUSION  05/18/2002   C5/6; C6/7 (Kritzer)   LUMBAR FUSION     REPLACEMENT TOTAL KNEE Right 08/04/2023   VASECTOMY  01/18/1977   Dr. Nestor    Family History  Problem Relation Age of Onset   Transient ischemic attack Father    Stroke Father    Arthritis Sister    Coronary artery disease Other        GF   Hypertension Other    Colon cancer Other    Diabetes Other    Ovarian cancer Maternal Grandmother    Parkinsonism Mother    Arthritis Sister    Coronary artery disease Other    Colon cancer Other    Prostate cancer Neg Hx     Allergies  Allergen Reactions   Ciprofloxacin Other (See Comments) and Swelling    REACTION: PALPITATIONS  Other Reaction(s): Not available  ciprofloxacin   Oxycodone  Anxiety and Other (See Comments)    Oxycodone  causes constipation and makes him sick to his stomach. Says that he cannot take narcotics only non-narcs.    Misc. Sulfonamide Containing Compounds Other (See Comments)   Sulfa  Antibiotics  Other (See Comments)       Latest Ref Rng &  Units 08/27/2023    6:11 AM 08/26/2023    6:14 PM 08/13/2023    1:24 PM  CBC  WBC 4.0 - 10.5 K/uL 7.4  12.3  8.7   Hemoglobin 13.0 - 17.0 g/dL 87.7  85.9  85.9   Hematocrit 39.0 - 52.0 % 36.7  40.7  43.8   Platelets 150 - 400 K/uL 345  450  344       CMP     Component Value Date/Time   NA 139 08/27/2023 0611   K 3.9 08/27/2023 0611   CL 104 08/27/2023 0611   CO2 25 08/27/2023 0611   GLUCOSE 97 08/27/2023 0611   BUN 11 08/27/2023 0611   CREATININE 0.73 08/27/2023 0611   CALCIUM 9.0 08/27/2023 0611   PROT 7.1 03/13/2012 0851   ALBUMIN 4.2 03/13/2012 0851   AST 23 03/13/2012 0851   ALT 16 03/13/2012 0851   ALKPHOS 51 03/13/2012 0851   BILITOT 1.0 03/13/2012 0851   GFR 75.06 03/13/2012 0851   GFRNONAA >60 08/27/2023 0611     No results found.     Assessment & Plan:   1. Acute deep vein thrombosis (DVT) of calf muscle vein of right lower extremity (HCC) (Primary) Vascular surgery always recommends patient's be on anticoagulation, such as Eliquis  which he is on, for at least 6 months post diagnosis of a DVT.  However patients have undergone certain procedures without any extension or complications of a DVT after being on anticoagulation such as Eliquis  for 90 days.  Patient is scheduled for surgery on November 17 which would put him 10 to 20 days post being on anticoagulation for 90 days.   Patient returns today to have venous ultrasounds of his bilateral lower extremities to check for DVT.  They were negative today.  His DVT is soft.  Patient would like to stop his Eliquis  for his scheduled TURP on 12/05/2023.  We discussed in detail today both the patient and his wife the complications regarding stopping Eliquis  such as stroke heart attack and even death.  They both verbalized her understanding.  However he is now 90 days post with resolution of DVT to his lower extremity so I feel he will be okay with stopping the Eliquis  on Saturday  1110 giving him 7 days prior to surgery to be off his Eliquis .  We also had a long conversation regarding the signs and symptoms of DVT and if he feels he has any of the symptoms he needs to come see us  immediately for repeat ultrasounds and possibly restarting his Eliquis  at that time and evaluating him further for other difficulties.  Both patient and wife verbalized understanding.  2. Benign prostatic hyperplasia with urinary obstruction Patient scheduled for surgery on 12/05/2023 for TURP for BPH and urinary obstruction.  Patient has been cleared to stop his Eliquis  now that he is 90 days post the origination of his DVT and the start of his Eliquis .  Again patient wife instructed on symptoms of DVT and if he has any of the symptoms postoperatively after he returns home from his surgery he needs to contact us  immediately for further evaluation.   Current Outpatient Medications on File Prior to Visit  Medication Sig Dispense Refill   alfuzosin (UROXATRAL) 10 MG 24 hr tablet Take 10 mg by mouth daily.     apixaban  (ELIQUIS ) 5 MG TABS tablet Take 1 tablet (5 mg total) by mouth 2 (two) times daily. Take two tablets (10mg ) by mouth 2 (two) times daily  for the first 7 days, then take one tablet (5mg ) by mouth 2 (two) times daily. 74 tablet 0   finasteride  (PROSCAR ) 5 MG tablet Take 5 mg by mouth daily.     memantine  (NAMENDA ) 10 MG tablet Take 10 mg by mouth 2 (two) times daily.     azithromycin  (ZITHROMAX ) 500 MG tablet Take 500 mg by mouth every Monday, Wednesday, and Friday.     Cholecalciferol (VITAMIN D) 50 MCG (2000 UT) CAPS Take 2,000 Units by mouth daily.     fosfomycin (MONUROL) 3 g PACK Take 3 g by mouth once.     MAGNESIUM  GLYCINATE PO Take 300 mg by mouth daily.     Povidone (IVIZIA DRY EYES OP) Place 1 drop into both eyes in the morning and at bedtime.     prednisoLONE acetate (PRED FORTE) 1 % ophthalmic suspension Place 1 drop into the left eye daily.     No current  facility-administered medications on file prior to visit.    There are no Patient Instructions on file for this visit. No follow-ups on file.   Gwendlyn JONELLE Shank, NP

## 2023-12-01 ENCOUNTER — Encounter
Admission: RE | Admit: 2023-12-01 | Discharge: 2023-12-01 | Disposition: A | Discharge status: Home / Self Care | Source: Ambulatory Visit | Attending: Urology | Admitting: Urology

## 2023-12-01 DIAGNOSIS — Z7901 Long term (current) use of anticoagulants: Secondary | ICD-10-CM | POA: Diagnosis not present

## 2023-12-01 DIAGNOSIS — Z01812 Encounter for preprocedural laboratory examination: Secondary | ICD-10-CM | POA: Diagnosis present

## 2023-12-01 LAB — BASIC METABOLIC PANEL WITH GFR
Anion gap: 11 (ref 5–15)
BUN: 13 mg/dL (ref 8–23)
CO2: 25 mmol/L (ref 22–32)
Calcium: 9.3 mg/dL (ref 8.9–10.3)
Chloride: 101 mmol/L (ref 98–111)
Creatinine, Ser: 0.82 mg/dL (ref 0.61–1.24)
GFR, Estimated: >60 mL/min (ref >60)
Glucose, Bld: 91 mg/dL (ref 70–99)
Potassium: 4.2 mmol/L (ref 3.5–5.1)
Sodium: 137 mmol/L (ref 135–145)

## 2023-12-01 LAB — CBC
HCT: 44.6 % (ref 39.0–52.0)
Hemoglobin: 14.7 g/dL (ref 13.0–17.0)
MCH: 28.8 pg (ref 26.0–34.0)
MCHC: 33 g/dL (ref 30.0–36.0)
MCV: 87.3 fL (ref 80.0–100.0)
Platelets: 237 K/uL (ref 150–400)
RBC: 5.11 MIL/uL (ref 4.22–5.81)
RDW: 13.3 % (ref 11.5–15.5)
WBC: 7.2 K/uL (ref 4.0–10.5)
nRBC: 0 % (ref 0.0–0.2)

## 2023-12-02 NOTE — Anesthesia Preprocedure Evaluation (Addendum)
 Anesthesia Evaluation  Patient identified by MRN, date of birth, ID band Patient awake and Patient confused    Reviewed: Allergy & Precautions, H&P , NPO status , Patient's Chart, lab work & pertinent test results  Airway Mallampati: III  TM Distance: >3 FB Neck ROM: full    Dental no notable dental hx.    Pulmonary sleep apnea    Pulmonary exam normal        Cardiovascular negative cardio ROS Normal cardiovascular exam     Neuro/Psych  PSYCHIATRIC DISORDERS     Dementia Dementia with visual impairment due to posterior cerebral cortical atrophy Alzheimer diseasenegative neurological ROS     GI/Hepatic negative GI ROS, Neg liver ROS,,,  Endo/Other  negative endocrine ROS    Renal/GU      Musculoskeletal   Abdominal Normal abdominal exam  (+)   Peds  Hematology negative hematology ROS (+)   Anesthesia Other Findings Pt has been treated for LE DVT. Repeat ultrasound was negative. Anticoagulation is held.   Past Medical History: No date: Alzheimer disease (HCC) No date: Anal fissure No date: Arthritis No date: Back pain     Comment:  Lumbar ESI (Dr. Carles) No date: BPH with urinary obstruction No date: Dementia with visual impairment due to posterior cerebral  cortical atrophy (HCC) No date: Elevated PSA No date: Enlarged prostate No date: Gross hematuria No date: Headache No date: History of kidney stones 08/18/2001: HLD (hyperlipidemia) No date: Hx: UTI (urinary tract infection) No date: Indwelling Foley catheter present     Comment:  18  FR Coude foley catheter No date: Prostatitis No date: Sleep apnea  Past Surgical History: 05/18/2002: CERVICAL FUSION     Comment:  C5/6; C6/7 (Kritzer) No date: LUMBAR FUSION 08/04/2023: REPLACEMENT TOTAL KNEE; Right 01/18/1977: VASECTOMY     Comment:  Dr. Nestor     Reproductive/Obstetrics negative OB ROS                               Anesthesia Physical Anesthesia Plan  ASA: 3  Anesthesia Plan: General   Post-op Pain Management: Tylenol  PO (pre-op)* and Celebrex PO (pre-op)*   Induction: Intravenous  PONV Risk Score and Plan: 2 and Ondansetron  and Dexamethasone  Airway Management Planned: Oral ETT  Additional Equipment:   Intra-op Plan:   Post-operative Plan: Extubation in OR  Informed Consent: I have reviewed the patients History and Physical, chart, labs and discussed the procedure including the risks, benefits and alternatives for the proposed anesthesia with the patient or authorized representative who has indicated his/her understanding and acceptance.     Dental Advisory Given  Plan Discussed with: CRNA and Surgeon  Anesthesia Plan Comments:          Anesthesia Quick Evaluation

## 2023-12-04 MED ORDER — CEFEPIME-DEXTROSE 1-5 GM-%(50ML) IV SOLR
1.0000 g | INTRAVENOUS | Status: DC
  Filled 2023-12-04: qty 50

## 2023-12-04 MED ORDER — SODIUM CHLORIDE 0.9 % IV SOLN
1.0000 g | INTRAVENOUS | Status: AC
  Administered 2023-12-05: 1 g via INTRAVENOUS
  Filled 2023-12-04: qty 10

## 2023-12-05 ENCOUNTER — Ambulatory Visit: Payer: Self-pay | Admitting: Urgent Care

## 2023-12-05 ENCOUNTER — Ambulatory Visit
Admission: RE | Admit: 2023-12-05 | Discharge: 2023-12-05 | Disposition: A | Discharge status: Home / Self Care | Attending: Urology | Admitting: Urology

## 2023-12-05 ENCOUNTER — Encounter: Payer: Self-pay | Admitting: Urology

## 2023-12-05 ENCOUNTER — Encounter
Admission: RE | Disposition: A | Discharge status: Home / Self Care | Payer: Self-pay | Source: Home / Self Care | Attending: Urology

## 2023-12-05 ENCOUNTER — Ambulatory Visit: Payer: Self-pay | Admitting: Anesthesiology

## 2023-12-05 ENCOUNTER — Other Ambulatory Visit: Payer: Self-pay

## 2023-12-05 DIAGNOSIS — N3090 Cystitis, unspecified without hematuria: Secondary | ICD-10-CM | POA: Diagnosis not present

## 2023-12-05 DIAGNOSIS — N401 Enlarged prostate with lower urinary tract symptoms: Secondary | ICD-10-CM | POA: Insufficient documentation

## 2023-12-05 DIAGNOSIS — G4730 Sleep apnea, unspecified: Secondary | ICD-10-CM | POA: Diagnosis not present

## 2023-12-05 DIAGNOSIS — R338 Other retention of urine: Secondary | ICD-10-CM | POA: Diagnosis present

## 2023-12-05 DIAGNOSIS — N138 Other obstructive and reflux uropathy: Secondary | ICD-10-CM | POA: Insufficient documentation

## 2023-12-05 DIAGNOSIS — N3289 Other specified disorders of bladder: Secondary | ICD-10-CM | POA: Insufficient documentation

## 2023-12-05 DIAGNOSIS — Z86718 Personal history of other venous thrombosis and embolism: Secondary | ICD-10-CM | POA: Diagnosis not present

## 2023-12-05 DIAGNOSIS — G309 Alzheimer's disease, unspecified: Secondary | ICD-10-CM | POA: Diagnosis not present

## 2023-12-05 DIAGNOSIS — F0280 Dementia in other diseases classified elsewhere without behavioral disturbance: Secondary | ICD-10-CM | POA: Diagnosis not present

## 2023-12-05 HISTORY — PX: HOLEP-LASER ENUCLEATION OF THE PROSTATE WITH MORCELLATION: SHX6641

## 2023-12-05 SURGERY — ENUCLEATION, PROSTATE, USING LASER, WITH MORCELLATION
Anesthesia: General | Site: Prostate

## 2023-12-05 MED ORDER — CELECOXIB 200 MG PO CAPS
ORAL_CAPSULE | ORAL | Status: AC
  Filled 2023-12-05: qty 1

## 2023-12-05 MED ORDER — MIDAZOLAM HCL (PF) 2 MG/2ML IJ SOLN
INTRAMUSCULAR | Status: DC | PRN
  Administered 2023-12-05: 2 mg via INTRAVENOUS

## 2023-12-05 MED ORDER — EPHEDRINE SULFATE-NACL 50-0.9 MG/10ML-% IV SOSY
PREFILLED_SYRINGE | INTRAVENOUS | Status: DC | PRN
  Administered 2023-12-05 (×3): 5 mg via INTRAVENOUS

## 2023-12-05 MED ORDER — TRAMADOL HCL 50 MG PO TABS
50.0000 mg | ORAL_TABLET | Freq: Once | ORAL | Status: AC | PRN
  Administered 2023-12-05: 50 mg via ORAL

## 2023-12-05 MED ORDER — ACETAMINOPHEN 500 MG PO TABS
1000.0000 mg | ORAL_TABLET | Freq: Once | ORAL | Status: AC
  Administered 2023-12-05: 1000 mg via ORAL

## 2023-12-05 MED ORDER — MIDAZOLAM HCL 2 MG/2ML IJ SOLN
INTRAMUSCULAR | Status: AC
  Filled 2023-12-05: qty 2

## 2023-12-05 MED ORDER — ROCURONIUM BROMIDE 100 MG/10ML IV SOLN
INTRAVENOUS | Status: DC | PRN
Start: 2023-12-05 — End: 2023-12-05
  Administered 2023-12-05: 10 mg via INTRAVENOUS
  Administered 2023-12-05: 40 mg via INTRAVENOUS

## 2023-12-05 MED ORDER — CHLORHEXIDINE GLUCONATE 0.12 % MT SOLN
15.0000 mL | Freq: Once | OROMUCOSAL | Status: AC
  Administered 2023-12-05: 15 mL via OROMUCOSAL

## 2023-12-05 MED ORDER — PROPOFOL 1000 MG/100ML IV EMUL
INTRAVENOUS | Status: AC
  Filled 2023-12-05: qty 200

## 2023-12-05 MED ORDER — FENTANYL CITRATE (PF) 100 MCG/2ML IJ SOLN: 25.0000 ug | INTRAMUSCULAR | Status: DC | PRN

## 2023-12-05 MED ORDER — PHENYLEPHRINE 80 MCG/ML (10ML) SYRINGE FOR IV PUSH (FOR BLOOD PRESSURE SUPPORT)
PREFILLED_SYRINGE | INTRAVENOUS | Status: DC | PRN
Start: 2023-12-05 — End: 2023-12-05
  Administered 2023-12-05: 80 ug via INTRAVENOUS
  Administered 2023-12-05: 160 ug via INTRAVENOUS

## 2023-12-05 MED ORDER — SODIUM CHLORIDE 0.9 % IR SOLN
Status: DC | PRN
  Administered 2023-12-05: 15000 mL
  Administered 2023-12-05: 6000 mL

## 2023-12-05 MED ORDER — CELECOXIB 200 MG PO CAPS
200.0000 mg | ORAL_CAPSULE | Freq: Once | ORAL | Status: AC
  Administered 2023-12-05: 200 mg via ORAL

## 2023-12-05 MED ORDER — DROPERIDOL 2.5 MG/ML IJ SOLN: 0.6250 mg | Freq: Once | INTRAMUSCULAR | Status: DC | PRN

## 2023-12-05 MED ORDER — ACETAMINOPHEN 10 MG/ML IV SOLN: 1000.0000 mg | Freq: Once | INTRAVENOUS | Status: DC | PRN

## 2023-12-05 MED ORDER — FENTANYL CITRATE (PF) 100 MCG/2ML IJ SOLN
INTRAMUSCULAR | Status: DC | PRN
  Administered 2023-12-05: 50 ug via INTRAVENOUS

## 2023-12-05 MED ORDER — PROPOFOL 10 MG/ML IV BOLUS
INTRAVENOUS | Status: DC | PRN
  Administered 2023-12-05: 200 mg via INTRAVENOUS

## 2023-12-05 MED ORDER — ONDANSETRON HCL 4 MG/2ML IJ SOLN
INTRAMUSCULAR | Status: DC | PRN
Start: 2023-12-05 — End: 2023-12-05
  Administered 2023-12-05 (×2): 4 mg via INTRAVENOUS

## 2023-12-05 MED ORDER — CHLORHEXIDINE GLUCONATE 0.12 % MT SOLN
OROMUCOSAL | Status: AC
  Filled 2023-12-05: qty 15

## 2023-12-05 MED ORDER — SUGAMMADEX SODIUM 200 MG/2ML IV SOLN
INTRAVENOUS | Status: DC | PRN
  Administered 2023-12-05: 200 mg via INTRAVENOUS

## 2023-12-05 MED ORDER — FENTANYL CITRATE (PF) 100 MCG/2ML IJ SOLN
INTRAMUSCULAR | Status: AC
  Filled 2023-12-05: qty 2

## 2023-12-05 MED ORDER — SUCCINYLCHOLINE CHLORIDE 200 MG/10ML IV SOSY
PREFILLED_SYRINGE | INTRAVENOUS | Status: DC | PRN
Start: 2023-12-05 — End: 2023-12-05
  Administered 2023-12-05: 100 mg via INTRAVENOUS

## 2023-12-05 MED ORDER — LACTATED RINGERS IV SOLN: INTRAVENOUS | Status: DC

## 2023-12-05 MED ORDER — ACETAMINOPHEN 500 MG PO TABS
ORAL_TABLET | ORAL | Status: AC
Start: 2023-12-05 — End: 2023-12-05
  Filled 2023-12-05: qty 2

## 2023-12-05 MED ORDER — LIDOCAINE HCL (CARDIAC) PF 100 MG/5ML IV SOSY
PREFILLED_SYRINGE | INTRAVENOUS | Status: DC | PRN
  Administered 2023-12-05: 100 mg via INTRAVENOUS

## 2023-12-05 MED ORDER — ORAL CARE MOUTH RINSE: 15.0000 mL | Freq: Once | OROMUCOSAL | Status: AC

## 2023-12-05 MED ORDER — DEXAMETHASONE SOD PHOSPHATE PF 10 MG/ML IJ SOLN
INTRAMUSCULAR | Status: DC | PRN
  Administered 2023-12-05: 10 mg via INTRAVENOUS

## 2023-12-05 MED ORDER — STERILE WATER FOR IRRIGATION IR SOLN
Status: DC | PRN
  Administered 2023-12-05: 1000 mL

## 2023-12-05 MED ORDER — TRAMADOL HCL 50 MG PO TABS: 25.0000 mg | ORAL_TABLET | Freq: Four times a day (QID) | ORAL | 0 refills | Status: AC | PRN

## 2023-12-05 MED ORDER — TRAMADOL HCL 50 MG PO TABS
ORAL_TABLET | ORAL | Status: AC
  Filled 2023-12-05: qty 1

## 2023-12-05 SURGICAL SUPPLY — 24 items
ADAPTER IRRIG TUBE 2 SPIKE SOL (ADAPTER) ×2 IMPLANT
BAG URO DRAIN 4000ML (MISCELLANEOUS) ×1 IMPLANT
CATH URETL OPEN END 4X70 (CATHETERS) ×1 IMPLANT
CATH URTH STD 24FR FL 3W 2 (CATHETERS) ×1 IMPLANT
CONTAINER COLLECT MORCELLATR (MISCELLANEOUS) ×1 IMPLANT
DRAPE UTILITY 15X26 TOWEL STRL (DRAPES) IMPLANT
FIBER LASER MOSES 550 DFL (Laser) ×1 IMPLANT
FILTER OVERFLOW MORCELLATOR (FILTER) ×1 IMPLANT
GLOVE BIOGEL PI IND STRL 7.5 (GLOVE) ×1 IMPLANT
GOWN STRL REUS W/ TWL LRG LVL3 (GOWN DISPOSABLE) ×1 IMPLANT
GOWN STRL REUS W/ TWL XL LVL3 (GOWN DISPOSABLE) ×1 IMPLANT
HOLDER FOLEY CATH W/STRAP (MISCELLANEOUS) ×1 IMPLANT
KIT TURNOVER CYSTO (KITS) ×1 IMPLANT
MEMBRANE SLNG YLW 17 FOR INST (MISCELLANEOUS) ×1 IMPLANT
MORCELLATOR ROTATION 4.75 335 (MISCELLANEOUS) ×1 IMPLANT
PACK CYSTO AR (MISCELLANEOUS) ×1 IMPLANT
SET CYSTO IRRIGATION (SET/KITS/TRAYS/PACK) ×1 IMPLANT
SET IRRIG Y TYPE TUR BLADDER L (SET/KITS/TRAYS/PACK) ×1 IMPLANT
SLEEVE PROTECTION STRL DISP (MISCELLANEOUS) ×2 IMPLANT
SOL .9 NS 3000ML IRR UROMATIC (IV SOLUTION) ×5 IMPLANT
SOLN STERILE WATER BTL 1000 ML (IV SOLUTION) ×1 IMPLANT
SURGILUBE 2OZ TUBE FLIPTOP (MISCELLANEOUS) ×1 IMPLANT
SYRINGE TOOMEY IRRIG 70ML (MISCELLANEOUS) ×1 IMPLANT
TUBE PUMP MORCELLATOR PIRANHA (TUBING) ×1 IMPLANT

## 2023-12-05 NOTE — Addendum Note (Signed)
 Addendum  created 12/05/23 1149 by Ledora Duncan, CRNA   Flowsheet accepted, Intraprocedure Flowsheets edited

## 2023-12-05 NOTE — Transfer of Care (Signed)
 Immediate Anesthesia Transfer of Care Note  Patient: Lucas Robinson Lady  Procedure(s) Performed: ENUCLEATION, PROSTATE, USING LASER, WITH MORCELLATION  Patient Location: PACU  Anesthesia Type:General  Level of Consciousness: awake, drowsy, and patient cooperative  Airway & Oxygen Therapy: Patient Spontanous Breathing  Post-op Assessment: Report given to RN and Post -op Vital signs reviewed and stable  Post vital signs: Reviewed and stable  Last Vitals:  Vitals Value Taken Time  BP 134/94 12/05/23 08:50  Temp 36 C 12/05/23 08:50  Pulse 85 12/05/23 08:51  Resp 17 12/05/23 08:51  SpO2 97 % 12/05/23 08:51  Vitals shown include unfiled device data.  Last Pain:  Vitals:   12/05/23 0630  TempSrc: Temporal  PainSc: 0-No pain         Complications: No notable events documented.

## 2023-12-05 NOTE — Anesthesia Procedure Notes (Signed)
 Procedure Name: Intubation Date/Time: 12/05/2023 7:50 AM  Performed by: Ledora Duncan, CRNAPre-anesthesia Checklist: Patient identified, Emergency Drugs available, Suction available and Patient being monitored Patient Re-evaluated:Patient Re-evaluated prior to induction Oxygen Delivery Method: Circle system utilized Preoxygenation: Pre-oxygenation with 100% oxygen Induction Type: IV induction Ventilation: Mask ventilation without difficulty Laryngoscope Size: McGrath and 3 Grade View: Grade I Tube type: Oral Number of attempts: 1 Airway Equipment and Method: Stylet Placement Confirmation: ETT inserted through vocal cords under direct vision, positive ETCO2 and breath sounds checked- equal and bilateral Secured at: 21 cm Tube secured with: Tape Dental Injury: Teeth and Oropharynx as per pre-operative assessment

## 2023-12-05 NOTE — Progress Notes (Unsigned)
 Patient underwent HoLEP with Dr. Francisca on 12/05/2023.  His son, Thedora, and care partner, Devere.  His postoperative course has been as expected and uneventful.   Reviewed post operative course following HoLEP of temporary worsening of irritative voiding symptoms, SUI and retrograde ejaculation.  Pathology still pending.  Keflex 500 mg daily.    Patient is present today for a catheter removal.  60 ml of water was drained from the balloon. A 24 3-way FR foley cath was removed from the bladder no complications were noted . Patient tolerated well.  Performed by: CLOTILDA CORNWALL, PA-C   Follow up/ Additional notes: CLOTILDA CORNWALL, PA-C

## 2023-12-05 NOTE — Op Note (Signed)
 Date of procedure: 12/05/23  Preoperative diagnosis:  BPH with retention  Postoperative diagnosis:  Same  Procedure: HoLEP (Holmium Laser Enucleation of the Prostate)  Surgeon: Redell Burnet, MD  Anesthesia: General  Complications: None  Intraoperative findings:  Moderate size prostate with obstructive lateral lobes and bladder neck elevation, friable tissue within prostatic fossa Moderate bladder trabeculations, no suspicious lesions, mild catheter cystitis posterior wall Ureteral orifices and verumontanum intact at conclusion of case, excellent hemostasis  EBL: Minimal  Specimens: Prostate chips  Enucleation time: 17 minutes  Morcellation time: 10 minutes  Intra-op weight: 54g  Drains: 24 French three-way Foley, 60 cc in balloon  Indication: Lucas Robinson is a 73 y.o. patient with BPH and Foley dependent urinary retention, opted for HOLEP to resume spontaneous voiding.  After reviewing the management options for treatment, they elected to proceed with the above surgical procedure(s). We have discussed the potential benefits and risks of the procedure, side effects of the proposed treatment, the likelihood of the patient achieving the goals of the procedure, and any potential problems that might occur during the procedure or recuperation.  We specifically discussed the risks of bleeding, infection, hematuria and clot retention, need for additional procedures, possible overnight hospital stay, temporary urgency and incontinence, rare long-term incontinence, and retrograde ejaculation.  Informed consent has been obtained.   Description of procedure:  The patient was taken to the operating room and general anesthesia was induced.  The patient was placed in the dorsal lithotomy position, prepped and draped in the usual sterile fashion, and preoperative antibiotics(cefepime) were administered.  SCDs were placed for DVT prophylaxis.  A preoperative time-out was performed.   Fleeta Needs sounds were used to gently dilated the urethra up to 6F. The 33 French continuous flow resectoscope was inserted into the urethra using the visual obturator  The prostate was moderate in size with obstructive lateral lobes, bladder neck elevation, friable tissue within the prostatic fossa. The bladder was thoroughly inspected and notable for moderate trabeculations, mild catheter cystitis posterior wall, no suspicious lesions.  The ureteral orifices were located in orthotopic position.    The laser was set to 2 J and 60 Hz and early apical release was performed by making a circumferential mucosal incision proximal to the sphincter.  A lambda incision was then made proximal to the verumontanum.  The prostate was enucleated en bloc circumferentially into the bladder.  The capsule was examined and laser was used for meticulous hemostasis.    The 51 French resectoscope was then switched out for the 26 French nephroscope and prostate tissue was morcellated(Piranha) and the tissue sent to pathology.  A 24 French three-way catheter was inserted easily with the aid of a catheter guide, and 60 cc were placed in the balloon.  Urine was light pink.  The catheter irrigated easily with a Toomey syringe.  CBI was initiated.   The patient tolerated the procedure well without any immediate complications and was extubated and transferred to the recovery room in stable condition.  Urine was clear on fast CBI.  Disposition: Stable to PACU  Plan: Wean CBI in PACU, anticipate discharge home today with Foley removal in clinic in 2-3 days  Redell Burnet, MD 12/05/2023

## 2023-12-05 NOTE — Anesthesia Postprocedure Evaluation (Signed)
 Anesthesia Post Note  Patient: Lucas Robinson  Procedure(s) Performed: ENUCLEATION, PROSTATE, USING LASER, WITH MORCELLATION (Prostate)  Patient location during evaluation: PACU Anesthesia Type: General Level of consciousness: awake and alert Pain management: pain level controlled Vital Signs Assessment: post-procedure vital signs reviewed and stable Respiratory status: spontaneous breathing, nonlabored ventilation and respiratory function stable Cardiovascular status: blood pressure returned to baseline and stable Postop Assessment: no apparent nausea or vomiting Anesthetic complications: no   No notable events documented.   Last Vitals:  Vitals:   12/05/23 0929 12/05/23 0955  BP:  (!) 138/96  Pulse: 79 81  Resp: 20 18  Temp: (!) 36.1 C (!) 36.1 C  SpO2: 100% 100%    Last Pain:  Vitals:   12/05/23 0955  TempSrc: Temporal  PainSc: 6                  Camellia Merilee Louder

## 2023-12-05 NOTE — H&P (Signed)
   12/05/23 7:07 AM   [name] September 09, 1950 [id number]  CC: BPH with retention  HPI: Comorbid 73 year old male with BPH and Foley dependent urinary retention, benign prostate MRI, prostate measured 75g.  Failed multiple voiding trials despite maximal medical therapy.  He also has a history of DVT diagnosed July 2025.   PMH: Past Medical History:  Diagnosis Date   Alzheimer disease (HCC)    Anal fissure    Arthritis    Back pain    Lumbar ESI (Dr. 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 Nestor     Family History: Family History  Problem Relation Age of Onset   Transient ischemic attack Father    Stroke Father    Arthritis Sister    Coronary artery disease Other        GF   Hypertension Other    Colon cancer Other    Diabetes Other    Ovarian cancer Maternal Grandmother    Parkinsonism Mother    Arthritis Sister    Coronary artery disease Other    Colon cancer Other    Prostate cancer Neg Hx     Social History:  reports that he has never smoked. He has never used smokeless tobacco. He reports current alcohol use. He reports that he does not use drugs.  Physical Exam: BP (!) 151/91   Pulse 64   Temp 97.9 F (36.6 C) (Temporal)   Resp 18   SpO2 98%    Constitutional:  Alert and oriented, No acute distress. Cardiovascular: Regular rate and rhythm Respiratory: Clear to  auscultation bilaterally GI: Abdomen is soft, nontender, nondistended, no abdominal masses   Laboratory Data: Urine culture 10/28 Pseudomonas treated with antibiotics  Assessment & Plan:   73 year old male with BPH and Foley dependent urinary retention, opted for HOLEP.  Negative prostate MRI.  We discussed the risks and benefits of HoLEP at length.  The procedure requires general anesthesia and takes 1 to 2 hours, and a holmium laser is used to enucleate the prostate and push this tissue into the bladder.  A morcellator is then used to remove this tissue, which is sent for pathology.  The vast majority(>95%) of patients are able to discharge the same day with a catheter in place for 2 to 3 days, and will follow-up in clinic for a voiding trial.  We specifically discussed the risks of bleeding, infection, retrograde ejaculation, temporary urgency and urge incontinence, very low risk of long-term incontinence, urethral stricture/bladder neck contracture, pathologic evaluation of prostate tissue and possible detection of prostate cancer or other malignancy, and possible need for additional procedures.  HOLEP today   Redell Burnet, MD 12/05/2023  [facility] [address], [address] [place], [place] [address] [phone]

## 2023-12-06 ENCOUNTER — Encounter: Payer: Self-pay | Admitting: Urology

## 2023-12-07 ENCOUNTER — Encounter: Payer: Self-pay | Admitting: Urology

## 2023-12-07 ENCOUNTER — Ambulatory Visit (INDEPENDENT_AMBULATORY_CARE_PROVIDER_SITE_OTHER): Admitting: Urology

## 2023-12-07 VITALS — BP 150/81 | HR 66 | Ht 71.0 in | Wt 187.0 lb

## 2023-12-07 DIAGNOSIS — N138 Other obstructive and reflux uropathy: Secondary | ICD-10-CM

## 2023-12-07 DIAGNOSIS — N401 Enlarged prostate with lower urinary tract symptoms: Secondary | ICD-10-CM

## 2023-12-07 LAB — SURGICAL PATHOLOGY

## 2023-12-07 MED ORDER — CEPHALEXIN 250 MG PO CAPS
500.0000 mg | ORAL_CAPSULE | Freq: Once | ORAL | Status: AC
  Administered 2023-12-07: 500 mg via ORAL

## 2023-12-07 NOTE — Addendum Note (Signed)
 Addended by: Gaynor Ferreras E on: 12/07/2023 11:20 AM   Modules accepted: Orders

## 2023-12-07 NOTE — Patient Instructions (Signed)

## 2023-12-08 ENCOUNTER — Ambulatory Visit: Payer: Self-pay | Admitting: Urology

## 2024-03-21 ENCOUNTER — Ambulatory Visit: Admitting: Urology
